# Patient Record
Sex: Female | Born: 1960 | Race: Black or African American | Hispanic: No | Marital: Single | Smoking: Former smoker
Health system: Southern US, Community
[De-identification: ages and names within clinical notes are randomized; demographics above are authoritative.]

## PROBLEM LIST (undated history)

## (undated) DIAGNOSIS — E1169 Type 2 diabetes mellitus with other specified complication: Secondary | ICD-10-CM

## (undated) DIAGNOSIS — F32A Depression, unspecified: Secondary | ICD-10-CM

## (undated) DIAGNOSIS — E119 Type 2 diabetes mellitus without complications: Secondary | ICD-10-CM

## (undated) DIAGNOSIS — F419 Anxiety disorder, unspecified: Secondary | ICD-10-CM

## (undated) DIAGNOSIS — F329 Major depressive disorder, single episode, unspecified: Secondary | ICD-10-CM

## (undated) DIAGNOSIS — M349 Systemic sclerosis, unspecified: Secondary | ICD-10-CM

## (undated) DIAGNOSIS — Z860100 Personal history of colon polyps, unspecified: Secondary | ICD-10-CM

## (undated) DIAGNOSIS — H269 Unspecified cataract: Secondary | ICD-10-CM

## (undated) HISTORY — DX: Depression, unspecified: F32.A

## (undated) HISTORY — DX: Anxiety disorder, unspecified: F41.9

## (undated) HISTORY — DX: Hyperlipidemia, unspecified: E11.69

## (undated) HISTORY — DX: Systemic sclerosis, unspecified: M34.9

## (undated) HISTORY — PX: CHOLECYSTECTOMY: SHX55

## (undated) HISTORY — DX: Unspecified cataract: H26.9

## (undated) HISTORY — DX: Personal history of colonic polyps: Z86.010

## (undated) HISTORY — DX: Personal history of colon polyps, unspecified: Z86.0100

## (undated) HISTORY — DX: Type 2 diabetes mellitus without complications: E11.9

## (undated) HISTORY — DX: Major depressive disorder, single episode, unspecified: F32.9

## (undated) HISTORY — PX: BREAST BIOPSY: SHX20

---

## 2015-09-25 HISTORY — PX: OTHER SURGICAL HISTORY: SHX169

## 2017-12-27 ENCOUNTER — Encounter: Payer: Self-pay | Admitting: Family Medicine

## 2017-12-27 ENCOUNTER — Other Ambulatory Visit: Payer: Self-pay

## 2017-12-27 ENCOUNTER — Ambulatory Visit (INDEPENDENT_AMBULATORY_CARE_PROVIDER_SITE_OTHER): Payer: Medicare HMO | Admitting: Family Medicine

## 2017-12-27 VITALS — BP 118/62 | HR 96 | Temp 98.5°F | Ht 69.0 in | Wt 177.6 lb

## 2017-12-27 DIAGNOSIS — Z794 Long term (current) use of insulin: Secondary | ICD-10-CM

## 2017-12-27 DIAGNOSIS — F3342 Major depressive disorder, recurrent, in full remission: Secondary | ICD-10-CM | POA: Diagnosis not present

## 2017-12-27 DIAGNOSIS — E119 Type 2 diabetes mellitus without complications: Secondary | ICD-10-CM

## 2017-12-27 LAB — POCT GLYCOSYLATED HEMOGLOBIN (HGB A1C): Hemoglobin A1C: 8

## 2017-12-27 MED ORDER — SITAGLIPTIN PHOSPHATE 100 MG PO TABS: 100.0000 mg | ORAL_TABLET | Freq: Every day | ORAL | 4 refills | Status: DC

## 2017-12-27 MED ORDER — METFORMIN HCL 1000 MG PO TABS: 1000.0000 mg | ORAL_TABLET | Freq: Two times a day (BID) | ORAL | 4 refills | Status: DC
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
## 2017-12-27 NOTE — Patient Instructions (Addendum)
   IF you received an x-ray today, you will receive an invoice from [facility]. Please contact  Radiology at [phone] with questions or concerns regarding your invoice.   IF you received labwork today, you will receive an invoice from [facility]. Please contact [facility] at [phone] with questions or concerns regarding your invoice.   Our billing staff will not be able to assist you with questions regarding bills from these companies.  You will be contacted with the lab results as soon as they are available. The fastest way to get your results is to activate your My Chart account. Instructions are located on the last page of this paperwork. If you have not heard from us regarding the results in 2 weeks, please contact this office.      Diabetes and Foot Care Diabetes may cause you to have problems because of poor blood supply (circulation) to your feet and legs. This may cause the skin on your feet to become thinner, break easier, and heal more slowly. Your skin may become dry, and the skin may peel and crack. You may also have nerve damage in your legs and feet causing decreased feeling in them. You may not notice minor injuries to your feet that could lead to infections or more serious problems. Taking care of your feet is one of the most important things you can do for yourself. Follow these instructions at home:  Wear shoes at all times, even in the house. Do not go barefoot. Bare feet are easily injured.  Check your feet daily for blisters, cuts, and redness. If you cannot see the bottom of your feet, use a mirror or ask someone for help.  Wash your feet with warm water (do not use hot water) and mild soap. Then pat your feet and the areas between your toes until they are completely dry. Do not soak your feet as this can dry your skin.  Apply a moisturizing lotion or petroleum jelly (that does not contain alcohol and is unscented) to the skin on your feet and  to dry, brittle toenails. Do not apply lotion between your toes.  Trim your toenails straight across. Do not dig under them or around the cuticle. File the edges of your nails with an emery board or nail file.  Do not cut corns or calluses or try to remove them with medicine.  Wear clean socks or stockings every day. Make sure they are not too tight. Do not wear knee-high stockings since they may decrease blood flow to your legs.  Wear shoes that fit properly and have enough cushioning. To break in new shoes, wear them for just a few hours a day. This prevents you from injuring your feet. Always look in your shoes before you put them on to be sure there are no objects inside.  Do not cross your legs. This may decrease the blood flow to your feet.  If you find a minor scrape, cut, or break in the skin on your feet, keep it and the skin around it clean and dry. These areas may be cleansed with mild soap and water. Do not cleanse the area with peroxide, alcohol, or iodine.  When you remove an adhesive bandage, be sure not to damage the skin around it.  If you have a wound, look at it several times a day to make sure it is healing.  Do not use heating pads or hot water bottles. They may burn your skin. If you have lost   feeling in your feet or legs, you may not know it is happening until it is too late.  Make sure your health care provider performs a complete foot exam at least annually or more often if you have foot problems. Report any cuts, sores, or bruises to your health care provider immediately. Contact a health care provider if:  You have an injury that is not healing.  You have cuts or breaks in the skin.  You have an ingrown nail.  You notice redness on your legs or feet.  You feel burning or tingling in your legs or feet.  You have pain or cramps in your legs and feet.  Your legs or feet are numb.  Your feet always feel cold. Get help right away if:  There is increasing  redness, swelling, or pain in or around a wound.  There is a red line that goes up your leg.  Pus is coming from a wound.  You develop a fever or as directed by your health care provider.  You notice a bad smell coming from an ulcer or wound. This information is not intended to replace advice given to you by your health care provider. Make sure you discuss any questions you have with your health care provider. Document Released: 09/07/2000 Document Revised: 02/16/2016 Document Reviewed: 02/17/2013 Elsevier Interactive Patient Education  2017 Elsevier Inc.  

## 2017-12-28 LAB — CBC
Hematocrit: 38 % (ref 34.0–46.6)
Hemoglobin: 12.3 g/dL (ref 11.1–15.9)
MCH: 28.1 pg (ref 26.6–33.0)
MCHC: 32.4 g/dL (ref 31.5–35.7)
MCV: 87 fL (ref 79–97)
Platelets: 331 10*3/uL (ref 150–379)
RBC: 4.38 x10E6/uL (ref 3.77–5.28)
RDW: 14.5 % (ref 12.3–15.4)
WBC: 4.7 10*3/uL (ref 3.4–10.8)

## 2017-12-28 LAB — COMPREHENSIVE METABOLIC PANEL
ALT: 15 IU/L (ref 0–32)
AST: 12 IU/L (ref 0–40)
Albumin/Globulin Ratio: 1.6 (ref 1.2–2.2)
Albumin: 4.2 g/dL (ref 3.5–5.5)
Alkaline Phosphatase: 67 IU/L (ref 39–117)
BUN/Creatinine Ratio: 18 (ref 9–23)
BUN: 10 mg/dL (ref 6–24)
Bilirubin Total: 0.3 mg/dL (ref 0.0–1.2)
CO2: 25 mmol/L (ref 20–29)
Calcium: 9.5 mg/dL (ref 8.7–10.2)
Chloride: 101 mmol/L (ref 96–106)
Creatinine, Ser: 0.57 mg/dL (ref 0.57–1.00)
GFR calc Af Amer: 118 mL/min/{1.73_m2} (ref >59)
GFR calc non Af Amer: 103 mL/min/{1.73_m2} (ref >59)
Globulin, Total: 2.6 g/dL (ref 1.5–4.5)
Glucose: 236 mg/dL — ABNORMAL HIGH (ref 65–99)
Potassium: 4.5 mmol/L (ref 3.5–5.2)
Sodium: 142 mmol/L (ref 134–144)
Total Protein: 6.8 g/dL (ref 6.0–8.5)

## 2017-12-28 LAB — LIPID PANEL
Chol/HDL Ratio: 2.3 ratio (ref 0.0–4.4)
Cholesterol, Total: 122 mg/dL (ref 100–199)
HDL: 53 mg/dL (ref >39)
LDL Calculated: 50 mg/dL (ref 0–99)
Triglycerides: 93 mg/dL (ref 0–149)
VLDL Cholesterol Cal: 19 mg/dL (ref 5–40)

## 2017-12-28 LAB — TSH: TSH: 1.23 u[IU]/mL (ref 0.450–4.500)

## 2017-12-28 LAB — MICROALBUMIN / CREATININE URINE RATIO
Creatinine, Urine: 83.5 mg/dL
Microalb/Creat Ratio: <3.6 mg/g creat (ref 0.0–30.0)
Microalbumin, Urine: <3 ug/mL

## 2018-01-01 ENCOUNTER — Encounter: Payer: Self-pay | Admitting: *Deleted

## 2018-03-19 ENCOUNTER — Telehealth: Payer: Self-pay | Admitting: Family Medicine

## 2018-03-19 NOTE — Telephone Encounter (Signed)
**  tried to call pt to let her know that her 03/28/18 appt with Dr. Leretha PolSantiago will be in the [address]. Her phone is not accepting calls at this time**  If pt calls back, please inform her that her appt on 03/28/18 with Dr. Leretha PolSantiago will be in the 102 building instead of the [address] b[address]**  Thanks!

## 2018-03-28 ENCOUNTER — Ambulatory Visit: Payer: Medicare HMO | Admitting: Family Medicine

## 2018-04-01 ENCOUNTER — Encounter: Payer: Self-pay | Admitting: Family Medicine

## 2018-04-01 ENCOUNTER — Ambulatory Visit (INDEPENDENT_AMBULATORY_CARE_PROVIDER_SITE_OTHER): Payer: Medicare HMO | Admitting: Family Medicine

## 2018-04-01 VITALS — BP 116/62 | HR 82 | Temp 98.2°F | Ht 69.0 in | Wt 179.2 lb

## 2018-04-01 DIAGNOSIS — F3342 Major depressive disorder, recurrent, in full remission: Secondary | ICD-10-CM

## 2018-04-01 DIAGNOSIS — M349 Systemic sclerosis, unspecified: Secondary | ICD-10-CM

## 2018-04-01 DIAGNOSIS — Z794 Long term (current) use of insulin: Secondary | ICD-10-CM

## 2018-04-01 DIAGNOSIS — E119 Type 2 diabetes mellitus without complications: Secondary | ICD-10-CM | POA: Diagnosis not present

## 2018-04-01 LAB — POCT GLYCOSYLATED HEMOGLOBIN (HGB A1C): Hemoglobin A1C: 8.2 % — AB (ref 4.0–5.6)

## 2018-04-01 MED ORDER — SITAGLIPTIN PHOSPHATE 25 MG PO TABS: 25.0000 mg | ORAL_TABLET | Freq: Every day | ORAL | 4 refills | Status: DC

## 2018-04-01 NOTE — Patient Instructions (Signed)
     IF you received an x-ray today, you will receive an invoice from [facility]. Please contact [facility] at [phone] with questions or concerns regarding your invoice.   IF you received labwork today, you will receive an invoice from [facility]. Please contact [facility] at [phone] with questions or concerns regarding your invoice.   Our billing staff will not be able to assist you with questions regarding bills from these companies.  You will be contacted with the lab results as soon as they are available. The fastest way to get your results is to activate your My Chart account. Instructions are located on the last page of this paperwork. If you have not heard from us regarding the results in 2 weeks, please contact this office.     

## 2018-04-01 NOTE — Progress Notes (Signed)
7/9/20192:52 PM  Jennifer KennerFay Gaeta September 28, 1960, 57 y.o. female [id number]  Chief Complaint  Patient presents with  . Follow-up    DM management. Stopped taking the Januvia everyday due to giving her the shakes. Walks alot, legs are jumpy when she lays down to sleep    HPI:   Patient is a 57 y.o. female with past medical history significant for DM2, scleroderma and depression who presents today for followup  States she had to stop [place] as it was causing sx of hypoglycemia sp after her walks Last a1c 8.0 April 2019 LDL 50 April 2019 She otherwise has been taking her other meds as prescribed, denies any side effects She reports depression is well controlled She is adjusting well, has a routine She has no acute concerns today Wondering if she could do cool sculpting with scleroderma  Fall Risk  04/01/2018 12/27/2017  Falls in the past year? No No     Depression screen PHQ 2/9 04/01/2018  Decreased Interest 0  Down, Depressed, Hopeless 0  PHQ - 2 Score 0    Allergies  Allergen Reactions  . Aspirin     Prior to Admission medications   Medication Sig Start Date End Date Taking? Authorizing Provider  aluminum-magnesium hydroxide-simethicone (MAALOX) 200-200-20 MG/5ML SUSP Take 30 mLs by mouth 4 (four) times daily -  before meals and at bedtime.    [provider]  atorvastatin (LIPITOR) 20 MG tablet Take 20 mg by mouth 1 day or 1 dose.    [provider]  citalopram (CELEXA) 20 MG tablet Take 20 mg by mouth daily.    [provider]  metFORMIN (GLUCOPHAGE) 1000 MG tablet Take 1 tablet (1,000 mg total) by mouth 2 (two) times daily with a meal. 12/27/17   Myles LippsSantiago, Irma M, MD  sitaGLIPtin (JANUVIA) 100 MG tablet Take 1 tablet (100 mg total) by mouth daily. 12/27/17   Myles LippsSantiago, Irma M, MD    Past Medical History:  Diagnosis Date  . Anxiety   . Cataract   . Depression   . Diabetes mellitus without complication (HCC)   . History of colonic polyps   .  Scleroderma [facility](HCC)     Past Surgical History:  Procedure Laterality Date  . CHOLECYSTECTOMY    . colonoscopy  2017   + polyps, repeat in 5 years    Social History   Tobacco Use  . Smoking status: Never Smoker  . Smokeless tobacco: Never Used  Substance Use Topics  . Alcohol use: Never    Frequency: Never    Family History  Problem Relation Age of Onset  . Diabetes Mother   . Mental illness Mother   . Cancer Father     Review of Systems  Constitutional: Negative for chills and fever.  Respiratory: Negative for cough and shortness of breath.   Cardiovascular: Negative for chest pain, palpitations and leg swelling.  Gastrointestinal: Negative for abdominal pain, nausea and vomiting.     OBJECTIVE:  Blood pressure 116/62, pulse 82, temperature 98.2 F (36.8 C), temperature source Oral, height 5\' 9"  (1.753 m), weight 179 lb 3.2 oz (81.3 kg), SpO2 99 %.  Physical Exam  Constitutional: She is oriented to person, place, and time. She appears well-developed and well-nourished.  HENT:  Head: Normocephalic and atraumatic.  Mouth/Throat: Mucous membranes are normal.  Eyes: Pupils are equal, round, and reactive to light. EOM are normal. No scleral icterus.  Neck: Neck supple.  Pulmonary/Chest: Effort normal.  Neurological: She is alert and oriented  to person, place, and time.  Skin: Skin is warm and dry.  Psychiatric: She has a normal mood and affect.  Nursing note and vitals reviewed.   Results for orders placed or performed in visit on 04/01/18 (from the past 24 hour(s))  POCT glycosylated hemoglobin (Hb A1C)     Status: Abnormal   Collection Time: 04/01/18  3:12 PM  Result Value Ref Range   Hemoglobin A1C 8.2 (A) 4.0 - 5.6 %   HbA1c POC (<> result, manual entry)  4.0 - 5.6 %   HbA1c, POC (prediabetic range)  5.7 - 6.4 %   HbA1c, POC (controlled diabetic range)  0.0 - 7.0 %      ASSESSMENT and PLAN  1. Type 2 diabetes mellitus without complication, with  long-term current use of insulin (HCC) Not controlled, did not tolerate max dose of januvia, decreasing dose. Cont with diet and exercise changes.  - POCT glycosylated hemoglobin (Hb A1C) - sitaGLIPtin (JANUVIA) 25 MG tablet; Take 1 tablet (25 mg total) by mouth daily.  2. Recurrent major depressive disorder, in full remission (HCC) Controlled. Continue current regime.   I told patient I would try to investigate regarding scleroderma and coolscultping but that she should also ask the dermatologist providing treatment.  Return in about 3 months (around 07/02/2018).    Myles Lipps, MD Primary Care at [facility] [address] [place], [place] [address] Ph.  [phone] Fax [phone]

## 2018-05-12 ENCOUNTER — Telehealth: Payer: Self-pay | Admitting: General Practice

## 2018-05-12 NOTE — Telephone Encounter (Signed)
Copied from CRM [id number]. Topic: Quick Communication - Rx Refill/Question >> May 12, 2018 12:31 PM Crist InfanteHarrald, Kathy J wrote: Medication:  atorvastatin (LIPITOR) 20 MG tablet  citalopram (CELEXA) 20 MG tablet  Pt is new to Dr Leretha PolSantiago, so will be new scripts sent for pt from the dr 90 day [facility] - [place], [place] - [address] [phone] (Phone) [phone] (Fax)

## 2018-05-15 MED ORDER — CITALOPRAM HYDROBROMIDE 20 MG PO TABS: 20.0000 mg | ORAL_TABLET | Freq: Every day | ORAL | 1 refills | Status: DC

## 2018-05-15 MED ORDER — ATORVASTATIN CALCIUM 20 MG PO TABS: 20.0000 mg | ORAL_TABLET | ORAL | 1 refills | Status: DC

## 2018-05-15 NOTE — Telephone Encounter (Signed)
Patient had already established with me in July Refilled for 90 days and 1 refill

## 2018-05-15 NOTE — Addendum Note (Signed)
Addended by: Myles LippsSANTIAGO, Sharisa Toves M on: 05/15/2018 06:02 PM   Modules accepted: Orders

## 2018-06-05 ENCOUNTER — Telehealth: Payer: Self-pay

## 2018-06-05 MED ORDER — ATORVASTATIN CALCIUM 20 MG PO TABS: 20.0000 mg | ORAL_TABLET | Freq: Every day | ORAL | 1 refills | Status: DC

## 2018-06-05 NOTE — Telephone Encounter (Addendum)
Jennifer Rhodes w/[facility] [phone] need clarification on the Atorvastatin prescription.  He said the instructions says one day or one dose, and that must be a mistake.

## 2018-06-05 NOTE — Telephone Encounter (Signed)
Pls verify how you want this medication to be taken Atorvastatin 20mg  sig currently written: Take po 1 day or 1 dose  [facility] needs clarification  Thanks

## 2018-06-05 NOTE — Telephone Encounter (Signed)
Corrected rx sent

## 2018-06-06 ENCOUNTER — Other Ambulatory Visit: Payer: Self-pay | Admitting: Family Medicine

## 2018-07-01 ENCOUNTER — Ambulatory Visit: Payer: Medicare HMO | Admitting: Family Medicine

## 2018-07-16 ENCOUNTER — Ambulatory Visit (INDEPENDENT_AMBULATORY_CARE_PROVIDER_SITE_OTHER): Payer: Medicare HMO | Admitting: Family Medicine

## 2018-07-16 ENCOUNTER — Encounter: Payer: Self-pay | Admitting: Family Medicine

## 2018-07-16 VITALS — BP 134/72 | HR 80 | Temp 98.1°F | Resp 17 | Ht 69.0 in | Wt 180.0 lb

## 2018-07-16 DIAGNOSIS — F3342 Major depressive disorder, recurrent, in full remission: Secondary | ICD-10-CM | POA: Diagnosis not present

## 2018-07-16 DIAGNOSIS — Z23 Encounter for immunization: Secondary | ICD-10-CM

## 2018-07-16 DIAGNOSIS — Z794 Long term (current) use of insulin: Secondary | ICD-10-CM | POA: Diagnosis not present

## 2018-07-16 DIAGNOSIS — E119 Type 2 diabetes mellitus without complications: Secondary | ICD-10-CM | POA: Diagnosis not present

## 2018-07-16 MED ORDER — CITALOPRAM HYDROBROMIDE 20 MG PO TABS: 20.0000 mg | ORAL_TABLET | Freq: Every day | ORAL | 3 refills | Status: DC

## 2018-07-16 MED ORDER — ATORVASTATIN CALCIUM 20 MG PO TABS: 20.0000 mg | ORAL_TABLET | Freq: Every day | ORAL | 3 refills | Status: DC

## 2018-07-16 MED ORDER — SITAGLIPTIN PHOSPHATE 25 MG PO TABS: 25.0000 mg | ORAL_TABLET | Freq: Every day | ORAL | 1 refills | Status: DC

## 2018-07-16 MED ORDER — METFORMIN HCL 1000 MG PO TABS: ORAL_TABLET | ORAL | 1 refills | Status: DC

## 2018-07-16 NOTE — Patient Instructions (Signed)
° ° ° °  If you have lab work done today you will be contacted with your lab results within the next 2 weeks.  If you have not heard from us then please contact us. The fastest way to get your results is to register for My Chart. ° ° °IF you received an x-ray today, you will receive an invoice from [facility]. Please contact [facility] at [phone] with questions or concerns regarding your invoice.  ° °IF you received labwork today, you will receive an invoice from [facility]. Please contact [facility] at [phone] with questions or concerns regarding your invoice.  ° °Our billing staff will not be able to assist you with questions regarding bills from these companies. ° °You will be contacted with the lab results as soon as they are available. The fastest way to get your results is to activate your My Chart account. Instructions are located on the last page of this paperwork. If you have not heard from us regarding the results in 2 weeks, please contact this office. °  ° ° ° °

## 2018-07-16 NOTE — Progress Notes (Signed)
10/23/20192:58 PM  Jennifer Rhodes 11/09/1960, 57 y.o. female [id number]  Chief Complaint  Patient presents with  . Follow-up    diabetes    HPI:   Patient is a 57 y.o. female with past medical history significant for DM2, scleroderma and depression who presents today for followup  Overall doing well Has not been checking cbgs of lately, needs more strips Tolerating low dose januvia well Never got prescription for lipitor Needs referral to eye doctor  Depression is well controlled  Lab Results  Component Value Date   HGBA1C 8.2 (A) 04/01/2018   HGBA1C 8 12/27/2017   Lab Results  Component Value Date   LDLCALC 50 12/27/2017   CREATININE 0.57 12/27/2017    Fall Risk  04/01/2018 12/27/2017  Falls in the past year? No No     Depression screen PHQ 2/9 04/01/2018  Decreased Interest 0  Down, Depressed, Hopeless 0  PHQ - 2 Score 0    Allergies  Allergen Reactions  . Aspirin     Prior to Admission medications   Medication Sig Start Date End Date Taking? Authorizing Provider  citalopram (CELEXA) 20 MG tablet Take 1 tablet (20 mg total) by mouth daily. 05/15/18  Yes Myles Lipps, MD  metFORMIN (GLUCOPHAGE) 1000 MG tablet TAKE 1 TABLET BY MOUTH TWICE DAILY WITH A MEAL 06/06/18  Yes Myles Lipps, MD  sitaGLIPtin (JANUVIA) 25 MG tablet Take 1 tablet (25 mg total) by mouth daily. 04/01/18  Yes Myles Lipps, MD  aluminum-magnesium hydroxide-simethicone (MAALOX) 200-200-20 MG/5ML SUSP Take 30 mLs by mouth 4 (four) times daily -  before meals and at bedtime.    [provider]  atorvastatin (LIPITOR) 20 MG tablet Take 1 tablet (20 mg total) by mouth daily. Patient not taking: Reported on 07/16/2018 06/05/18   Myles Lipps, MD    Past Medical History:  Diagnosis Date  . Anxiety   . Cataract   . Depression   . Diabetes mellitus without complication (HCC)   . History of colonic polyps   . Scleroderma [facility])     Past Surgical History:  Procedure Laterality  Date  . CHOLECYSTECTOMY    . colonoscopy  2017   + polyps, repeat in 5 years    Social History   Tobacco Use  . Smoking status: Never Smoker  . Smokeless tobacco: Never Used  Substance Use Topics  . Alcohol use: Never    Frequency: Never    Family History  Problem Relation Age of Onset  . Diabetes Mother   . Mental illness Mother   . Cancer Father     Review of Systems  Constitutional: Negative for chills and fever.  Respiratory: Negative for cough and shortness of breath.   Cardiovascular: Negative for chest pain, palpitations and leg swelling.  Gastrointestinal: Positive for abdominal pain (has been wearing a back brace). Negative for nausea and vomiting.     OBJECTIVE:  Blood pressure 134/72, pulse 80, temperature 98.1 F (36.7 C), temperature source Oral, resp. rate 17, height 5\' 9"  (1.753 m), weight 180 lb (81.6 kg), SpO2 98 %. Body mass index is 26.58 kg/m.   Wt Readings from Last 3 Encounters:  07/16/18 180 lb (81.6 kg)  04/01/18 179 lb 3.2 oz (81.3 kg)  12/27/17 177 lb 9.6 oz (80.6 kg)    Physical Exam  Constitutional: She is oriented to person, place, and time. She appears well-developed and well-nourished.  HENT:  Head: Normocephalic and atraumatic.  Mouth/Throat: Oropharynx is clear  and moist. No oropharyngeal exudate.  Eyes: Pupils are equal, round, and reactive to light. Conjunctivae and EOM are normal. No scleral icterus.  Neck: Neck supple.  Cardiovascular: Normal rate, regular rhythm and normal heart sounds. Exam reveals no gallop and no friction rub.  No murmur heard. Pulmonary/Chest: Effort normal and breath sounds normal. She has no wheezes. She has no rales.  Abdominal: Soft. Bowel sounds are normal. She exhibits no distension and no mass. There is no tenderness.  Musculoskeletal: She exhibits no edema.  Neurological: She is alert and oriented to person, place, and time.  Skin: Skin is warm and dry.  Psychiatric: She has a normal mood and  affect.  Nursing note and vitals reviewed.   ASSESSMENT and PLAN  1. Type 2 diabetes mellitus without complication, with long-term current use of insulin (HCC) Checking labs today, medications will be adjusted as needed.  - Basic Metabolic Panel - Hemoglobin A1c - Ambulatory referral to Ophthalmology  2. Recurrent major depressive disorder, in full remission (HCC) Controlled. Continue current regime.   Other orders - atorvastatin (LIPITOR) 20 MG tablet; Take 1 tablet (20 mg total) by mouth daily. - citalopram (CELEXA) 20 MG tablet; Take 1 tablet (20 mg total) by mouth daily. - metFORMIN (GLUCOPHAGE) 1000 MG tablet; TAKE 1 TABLET BY MOUTH TWICE DAILY WITH A MEAL - sitaGLIPtin (JANUVIA) 25 MG tablet; Take 1 tablet (25 mg total) by mouth daily. - Flu Vaccine QUAD 36+ mos IM   Return in about 3 months (around 10/16/2018).    Myles Lipps, MD Primary Care at [facility] [address] [place], [place] [address] Ph.  [phone] Fax [phone]

## 2018-07-17 LAB — BASIC METABOLIC PANEL
BUN/Creatinine Ratio: 16 (ref 9–23)
BUN: 10 mg/dL (ref 6–24)
CO2: 28 mmol/L (ref 20–29)
Calcium: 9.7 mg/dL (ref 8.7–10.2)
Chloride: 100 mmol/L (ref 96–106)
Creatinine, Ser: 0.62 mg/dL (ref 0.57–1.00)
GFR calc Af Amer: 114 mL/min/{1.73_m2} (ref >59)
GFR calc non Af Amer: 99 mL/min/{1.73_m2} (ref >59)
Glucose: 311 mg/dL — ABNORMAL HIGH (ref 65–99)
Potassium: 4.8 mmol/L (ref 3.5–5.2)
Sodium: 140 mmol/L (ref 134–144)

## 2018-07-17 LAB — HEMOGLOBIN A1C
Est. average glucose Bld gHb Est-mCnc: 249 mg/dL
Hgb A1c MFr Bld: 10.3 % — ABNORMAL HIGH (ref 4.8–5.6)

## 2018-07-17 MED ORDER — ERTUGLIFLOZIN L-PYROGLUTAMICAC 5 MG PO TABS: 5.0000 mg | ORAL_TABLET | Freq: Every day | ORAL | 2 refills | Status: DC

## 2018-07-17 NOTE — Addendum Note (Signed)
Addended by: Myles Lipps on: 07/17/2018 11:42 PM   Modules accepted: Orders

## 2018-07-23 ENCOUNTER — Telehealth: Payer: Self-pay | Admitting: Family Medicine

## 2018-07-23 NOTE — Telephone Encounter (Signed)
Copied from CRM [id number]. Topic: General - Other >> Jul 23, 2018  9:35 AM Elliot Gault wrote:  Relation to pt: self  Call back number: [phone] Pharmacy: [facility] [address] [place] - [address] [phone] (Phone) [phone] (Fax)  Reason for call: Patient states diabetic medication prescribed (patient doesn't know the name of Rx) not covered under [facility], patient requesting alternate as soon as possible.

## 2018-07-23 NOTE — Telephone Encounter (Signed)
Patient calls and says Steglatro 5mg  is not covered under [facility] and is requesting a new medication to be sent in to her pharmacy as soon as possible. Patient's CB [phone].  Pharmacy: [facility] [address] [place] - [address] [phone] (Phone) [phone] (Fax)

## 2018-07-24 MED ORDER — EMPAGLIFLOZIN 10 MG PO TABS: 10.0000 mg | ORAL_TABLET | Freq: Every day | ORAL | 2 refills | Status: DC

## 2018-07-24 NOTE — Telephone Encounter (Signed)
Please let patient know that I sent rx for jardiance 10mg  once a day to [facility]. thanks

## 2018-07-24 NOTE — Telephone Encounter (Signed)
Pt message sent to Dr. Leretha Pol Re: substitue for Steglatro (not covered by [facility])

## 2018-07-25 NOTE — Telephone Encounter (Signed)
Called and inform of Rx sent to pharmacy, she verbalized understanding.

## 2018-10-17 ENCOUNTER — Ambulatory Visit: Payer: Medicare HMO | Admitting: Family Medicine

## 2018-10-20 ENCOUNTER — Ambulatory Visit: Payer: Medicare HMO | Admitting: Family Medicine

## 2018-11-27 ENCOUNTER — Ambulatory Visit: Payer: Medicare HMO | Admitting: Family Medicine

## 2019-01-06 ENCOUNTER — Other Ambulatory Visit: Payer: Self-pay

## 2019-01-06 DIAGNOSIS — Z794 Long term (current) use of insulin: Principal | ICD-10-CM

## 2019-01-06 DIAGNOSIS — E119 Type 2 diabetes mellitus without complications: Secondary | ICD-10-CM

## 2019-01-16 ENCOUNTER — Other Ambulatory Visit: Payer: Self-pay

## 2019-01-16 ENCOUNTER — Telehealth (INDEPENDENT_AMBULATORY_CARE_PROVIDER_SITE_OTHER): Payer: Medicare PPO | Admitting: Family Medicine

## 2019-01-16 DIAGNOSIS — R002 Palpitations: Secondary | ICD-10-CM | POA: Diagnosis not present

## 2019-01-16 DIAGNOSIS — F3342 Major depressive disorder, recurrent, in full remission: Secondary | ICD-10-CM | POA: Diagnosis not present

## 2019-01-16 DIAGNOSIS — H538 Other visual disturbances: Secondary | ICD-10-CM

## 2019-01-16 DIAGNOSIS — E1165 Type 2 diabetes mellitus with hyperglycemia: Secondary | ICD-10-CM

## 2019-01-16 NOTE — Progress Notes (Signed)
Virtual Visit Note  I connected with patient on 01/16/19 at 1006am by telephone per patient's preference and verified that I am speaking with the correct person using two identifiers. Jennifer Rhodes is currently located at home and patient is currently with them during visit. The provider, Myles Lipps, MD is located in their office at time of visit.  I discussed the limitations, risks, security and privacy concerns of performing an evaluation and management service by telephone and the availability of in person appointments. I also discussed with the patient that there may be a patient responsible charge related to this service. The patient expressed understanding and agreed to proceed.   CC: diabetes followup  HPI ? Patient is a 58 y.o. female with past medical history significant for DM2, scleroderma and depressionwho presents today forfollowup  Last OV Oct 2019 Started rx for jardiance 10mg  She tried Theme park [employer] but caused stomach pain and dry mouth Taking metformin Walking and eating smaller portions Thinks she has lots weight, feels clothes are loser Not checking cbgs often, runs out strips, finances limiting Denies any polydipsia or polyuria Denies any numbness or tingling in her feet Denies any abd pain, nausea, vomiting Endorses occasional vision blurriness, worse at night, couple of months, has known cataracts, denies h/o retinopathy Denies chest pain, cough, SOB Endorses couple episodes of palpitations regular while walking, very short lived, denies any associated sx, last episode about a week ago  Endorses occasional ankle swelling after long periods of standing Feels depression is stable  Lab Results  Component Value Date   HGBA1C 10.3 (H) 07/16/2018   HGBA1C 8.2 (A) 04/01/2018   HGBA1C 8 12/27/2017   Lab Results  Component Value Date   LDLCALC 50 12/27/2017   CREATININE 0.62 07/16/2018    Allergies  Allergen Reactions  . Aspirin     Prior to Admission  medications   Medication Sig Start Date End Date Taking? Authorizing Provider  atorvastatin (LIPITOR) 20 MG tablet Take 1 tablet (20 mg total) by mouth daily. 07/16/18  Yes Myles Lipps, MD  citalopram (CELEXA) 20 MG tablet Take 1 tablet (20 mg total) by mouth daily. 07/16/18  Yes Myles Lipps, MD  metFORMIN (GLUCOPHAGE) 1000 MG tablet TAKE 1 TABLET BY MOUTH TWICE DAILY WITH A MEAL 07/16/18  Yes Myles Lipps, MD  aluminum-magnesium hydroxide-simethicone (MAALOX) 200-200-20 MG/5ML SUSP Take 30 mLs by mouth 4 (four) times daily -  before meals and at bedtime.    [provider]  empagliflozin (JARDIANCE) 10 MG TABS tablet Take 10 mg by mouth daily. Patient not taking: Reported on 01/16/2019 07/24/18   Myles Lipps, MD    Past Medical History:  Diagnosis Date  . Anxiety   . Cataract   . Depression   . Diabetes mellitus without complication (HCC)   . History of colonic polyps   . Scleroderma [facility])     Past Surgical History:  Procedure Laterality Date  . CHOLECYSTECTOMY    . colonoscopy  2017   + polyps, repeat in 5 years    Social History   Tobacco Use  . Smoking status: Never Smoker  . Smokeless tobacco: Never Used  Substance Use Topics  . Alcohol use: Never    Frequency: Never    Family History  Problem Relation Age of Onset  . Diabetes Mother   . Mental illness Mother   . Cancer Father     ROS Per hpi  Objective  Vitals as reported by the patient:  none   ASSESSMENT and PLAN  1. Type 2 diabetes mellitus with hyperglycemia, without long-term current use of insulin (HCC) Uncontrolled per last a1c. Has been working on LFM. Checking labs, meds will be adjusted as needed.  - Hemoglobin A1c; Future - Microalbumin/Creatinine Ratio, Urine; Future - Comprehensive metabolic panel; Future - TSH; Future - Lipid panel; Future - Ambulatory referral to Ophthalmology  2. Recurrent major depressive disorder, in full remission (HCC) Controlled.  Continue current regime.   3. Blurry vision, bilateral Referring to ophtho for eval, could be needs eyeglasses vs progression of cataracts vs new retinopathy - Ambulatory referral to Ophthalmology  4. Palpitations In setting of exercise, asymptomatic. ER precautions discussed.  - EKG 12-Lead; Future - CBC; Future  FOLLOW-UP: labs, BP and ekg within 1 week. followup with me in 4 weeks.    The above assessment and management plan was discussed with the patient. The patient verbalized understanding of and has agreed to the management plan. Patient is aware to call the clinic if symptoms persist or worsen. Patient is aware when to return to the clinic for a follow-up visit. Patient educated on when it is appropriate to go to the emergency department.    I provided 16 minutes of non-face-to-face time during this encounter.  Myles LippsIrma M Santiago, MD Primary Care at [facility] [address] [place], [place] [address] Ph.  [phone] Fax [phone]

## 2019-01-16 NOTE — Progress Notes (Signed)
3 m DM f/u

## 2019-02-23 ENCOUNTER — Other Ambulatory Visit: Payer: Self-pay | Admitting: Family Medicine

## 2019-02-23 NOTE — Telephone Encounter (Signed)
Routing medication refill to provider for review - no current lab work.

## 2019-02-25 ENCOUNTER — Other Ambulatory Visit: Payer: Self-pay

## 2019-02-25 ENCOUNTER — Telehealth: Payer: Self-pay | Admitting: Family Medicine

## 2019-02-25 DIAGNOSIS — E1165 Type 2 diabetes mellitus with hyperglycemia: Secondary | ICD-10-CM

## 2019-02-25 MED ORDER — METFORMIN HCL 1000 MG PO TABS: ORAL_TABLET | ORAL | 1 refills | Status: DC

## 2019-02-25 NOTE — Telephone Encounter (Signed)
Copied from CRM [id number]. Topic: Quick Communication - Rx Refill/Question >> Feb 25, 2019 12:38 PM Jaquita Rector A wrote: Medication: metFORMIN (GLUCOPHAGE) 1000 MG tablet  Per patient she is completely out of medication. Please advise  Has the patient contacted their pharmacy? Yes.   (Agent: If no, request that the patient contact the pharmacy for the refill.) (Agent: If yes, when and what did the pharmacy advise?)  Preferred Pharmacy (with phone number or street name): [facility] [address] [place], [place] - [address] [phone] (Phone) [phone] (Fax)    Agent: Please be advised that RX refills may take up to 3 business days. We ask that you follow-up with your pharmacy.

## 2019-02-25 NOTE — Telephone Encounter (Signed)
Rx sent to pharmacy   

## 2019-05-28 DIAGNOSIS — F418 Other specified anxiety disorders: Secondary | ICD-10-CM | POA: Diagnosis not present

## 2019-05-28 DIAGNOSIS — Z6826 Body mass index (BMI) 26.0-26.9, adult: Secondary | ICD-10-CM | POA: Diagnosis not present

## 2019-05-28 DIAGNOSIS — F3341 Major depressive disorder, recurrent, in partial remission: Secondary | ICD-10-CM | POA: Diagnosis not present

## 2019-05-28 DIAGNOSIS — E1165 Type 2 diabetes mellitus with hyperglycemia: Secondary | ICD-10-CM | POA: Diagnosis not present

## 2019-05-28 DIAGNOSIS — E785 Hyperlipidemia, unspecified: Secondary | ICD-10-CM | POA: Diagnosis not present

## 2019-05-28 DIAGNOSIS — M349 Systemic sclerosis, unspecified: Secondary | ICD-10-CM | POA: Diagnosis not present

## 2019-07-06 DIAGNOSIS — H25813 Combined forms of age-related cataract, bilateral: Secondary | ICD-10-CM | POA: Diagnosis not present

## 2019-07-06 DIAGNOSIS — H40053 Ocular hypertension, bilateral: Secondary | ICD-10-CM | POA: Diagnosis not present

## 2019-07-06 DIAGNOSIS — E119 Type 2 diabetes mellitus without complications: Secondary | ICD-10-CM | POA: Diagnosis not present

## 2019-07-06 LAB — HM DIABETES EYE EXAM

## 2019-07-13 ENCOUNTER — Telehealth: Payer: Self-pay | Admitting: Family Medicine

## 2019-07-13 NOTE — Telephone Encounter (Signed)
Medication refill:   atorvastatin (LIPITOR) 20 MG tablet [[id number]]   citalopram (CELEXA) 20 MG tablet [[id number]  Pharmacy:  [facility] - [place], [place] [phone] (Phone) [phone] (Fax)   Pt aware of turn around time

## 2019-07-21 ENCOUNTER — Other Ambulatory Visit: Payer: Self-pay

## 2019-07-21 ENCOUNTER — Telehealth (INDEPENDENT_AMBULATORY_CARE_PROVIDER_SITE_OTHER): Payer: Medicare PPO | Admitting: Family Medicine

## 2019-07-21 ENCOUNTER — Telehealth: Payer: Medicare PPO | Admitting: Family Medicine

## 2019-07-21 DIAGNOSIS — R6889 Other general symptoms and signs: Secondary | ICD-10-CM | POA: Diagnosis not present

## 2019-07-21 DIAGNOSIS — E1165 Type 2 diabetes mellitus with hyperglycemia: Secondary | ICD-10-CM

## 2019-07-21 DIAGNOSIS — F3342 Major depressive disorder, recurrent, in full remission: Secondary | ICD-10-CM | POA: Diagnosis not present

## 2019-07-21 MED ORDER — CITALOPRAM HYDROBROMIDE 20 MG PO TABS: 20.0000 mg | ORAL_TABLET | Freq: Every day | ORAL | 3 refills | Status: DC

## 2019-07-21 MED ORDER — BLOOD GLUCOSE METER KIT: PACK | 3 refills | Status: DC

## 2019-07-21 MED ORDER — ATORVASTATIN CALCIUM 20 MG PO TABS: 20.0000 mg | ORAL_TABLET | Freq: Every day | ORAL | 3 refills | Status: DC

## 2019-07-21 MED ORDER — METFORMIN HCL 1000 MG PO TABS: 1000.0000 mg | ORAL_TABLET | Freq: Two times a day (BID) | ORAL | 0 refills | Status: DC

## 2019-07-21 NOTE — Progress Notes (Signed)
Virtual Visit Note  I connected with patient on 07/21/19 at 612pm by phone (unable to connect thru [email or web address]) and verified that I am speaking with the correct person using two identifiers. Jennifer Rhodes is currently located at home and patient is currently with them during visit. The provider, Rutherford Guys, MD is located in their office at time of visit.  I discussed the limitations, risks, security and privacy concerns of performing an evaluation and management service by telephone and the availability of in person appointments. I also discussed with the patient that there may be a patient responsible charge related to this service. The patient expressed understanding and agreed to proceed.   CC: DM2 and depression  HPI ? Last OV April 2020 - telemedicine Has not had labs since Oct 2019 She did have labs done thru [facility] in sept A1c 8.3, takes metformin 1038m BID She has not checked glucose at home - needs a new kit She has been walking daily, monitoring her diet She has lost 10 lbs Went to eye doctor, needs cataract surgery in both eyes but otherwise no DM changes  [employer] also did screening for PAD ABI right 0.78, left 1.13 Takes atorvastatin and ASA Non smoker She denies any claudication or skin changes  Depression well controlled with citalopram Needs refills  Lab Results  Component Value Date   HGBA1C 10.3 (H) 07/16/2018    Allergies  Allergen Reactions  . Aspirin   . Jardiance [Empagliflozin] Other (See Comments)    abd pain and dry mouth    Prior to Admission medications   Medication Sig Start Date End Date Taking? Authorizing Provider  aluminum-magnesium hydroxide-simethicone (MAALOX) 2035-465-68MG/5ML SUSP Take 30 mLs by mouth 4 (four) times daily -  before meals and at bedtime.    [provider]  atorvastatin (LIPITOR) 20 MG tablet Take 1 tablet (20 mg total) by mouth daily. 07/16/18   SRutherford Guys MD  citalopram (CELEXA) 20 MG tablet  Take 1 tablet (20 mg total) by mouth daily. 07/16/18   SRutherford Guys MD  metFORMIN (GLUCOPHAGE) 1000 MG tablet TAKE 1 TABLET BY MOUTH TWICE DAILY WITH A MEAL 02/25/19   SRutherford Guys MD  metFORMIN (GLUCOPHAGE) 1000 MG tablet TAKE 1 TABLET BY MOUTH TWICE DAILY WITH A MEAL 02/25/19   SRutherford Guys MD    Past Medical History:  Diagnosis Date  . Anxiety   . Cataract   . Depression   . [place]   . History of colonic polyps   . Scleroderma ([facility]     Past Surgical History:  Procedure Laterality Date  . CHOLECYSTECTOMY    . colonoscopy  2017   + polyps, repeat in 5 years    Social History   Tobacco Use  . Smoking status: Never Smoker  . Smokeless tobacco: Never Used  Substance Use Topics  . Alcohol use: Never    Frequency: Never    Family History  Problem Relation Age of Onset  . Diabetes Mother   . Mental illness Mother   . Cancer Father     Review of Systems  Constitutional: Negative for chills and fever.  Respiratory: Negative for cough and shortness of breath.   Cardiovascular: Negative for chest pain, palpitations and leg swelling.  Gastrointestinal: Negative for abdominal pain, nausea and vomiting.    Objective  Vitals as reported by the patient: none   ASSESSMENT and PLAN  1. Type 2 diabetes mellitus with  hyperglycemia, without long-term current use of insulin (HCC) Improved, cont with LFM  2. Recurrent major depressive disorder, in full remission ([place]) Stable. Cont current mgt - citalopram (CELEXA) 20 MG tablet; Take 1 tablet (20 mg total) by mouth daily.  3. Abnormal ankle brachial index (ABI) Asymptomatic. Referring to vasc surg, anticipate medical mgt - Ambulatory referral to Vascular Surgery  Other orders - metFORMIN (GLUCOPHAGE) 1000 MG tablet; Take 1 tablet (1,000 mg total) by mouth 2 (two) times daily with a meal. - atorvastatin (LIPITOR) 20 MG tablet; Take 1 tablet (20 mg total) by mouth daily. -  blood glucose meter kit and supplies; Per insurance preference. Check blood glucose once a day. Dx E11.65  FOLLOW-UP: 4 weeks   The above assessment and management plan was discussed with the patient. The patient verbalized understanding of and has agreed to the management plan. Patient is aware to call the clinic if symptoms persist or worsen. Patient is aware when to return to the clinic for a follow-up visit. Patient educated on when it is appropriate to go to the emergency department.    I provided 14 minutes of non-face-to-face time during this encounter.  Rutherford Guys, MD Primary Care at [place], [place] [address] Ph.  [phone] Fax [phone]

## 2019-07-21 NOTE — Progress Notes (Signed)
Pt is following up with dm, says she has not been able to chk her glucose in 3 wks, order for new meter pending. She is asking for a refill on the celebrex, screening completed. No other medical concerns. Pharmacy is the [facility]

## 2019-07-22 NOTE — Progress Notes (Signed)
Spoke with pt and scheduled appt °

## 2019-07-29 ENCOUNTER — Telehealth: Payer: Self-pay | Admitting: Family Medicine

## 2019-07-29 NOTE — Telephone Encounter (Unsigned)
Copied from [place] [id number]. Topic: General - Other >> Jul 29, 2019 10:18 AM Rainey Pines A wrote: Patient would like a callback from nurse in regards to her blood sugar medication. Please advise

## 2019-07-29 NOTE — Telephone Encounter (Signed)
Rx was sent on 07/21/2019

## 2019-07-30 ENCOUNTER — Other Ambulatory Visit: Payer: Self-pay | Admitting: Family Medicine

## 2019-07-30 ENCOUNTER — Encounter: Payer: Self-pay | Admitting: *Deleted

## 2019-07-30 LAB — HEMOGLOBIN A1C: Hemoglobin A1C: 8.3

## 2019-07-30 MED ORDER — GLIPIZIDE 5 MG PO TABS: 2.5000 mg | ORAL_TABLET | Freq: Two times a day (BID) | ORAL | 3 refills | Status: DC

## 2019-07-30 NOTE — Telephone Encounter (Signed)
Please let patient know that I [facility]ide, she is to take 1/2 tab with her metformin before breakfast and dinner. Continue checking cbgs. Be mindful of low cbgs. Keep appt as scheduled. thanks

## 2019-07-30 NOTE — Telephone Encounter (Signed)
Dr. Pamella Pert ,  Patient states she is checking her blood sugar 3 times a day and it is has been 249-289 once it was 193 but has consistently been above 200 she is scheduled for an appointment  11-16.   Wanted some advise on her insulin or would you like to see her sooner.

## 2019-08-03 ENCOUNTER — Telehealth: Payer: Self-pay

## 2019-08-03 NOTE — Telephone Encounter (Signed)
I have received a phone message note stating that miss Jennifer Rhodes at [facility] has called about an abnormal ABI result.   I have spoken to the Dr. Pamella Pert and she stated that this screening has been done by [facility] and we are unsure why the results did not get scanned or abstracted.   We are able to reorder the ABI if it is recommended. (Per provider) Please let us know.   I have attempted to call Jennifer Rhodes back. No answer so I left a message to call back on a secure voice mail.   Plan: we can relay message bold above if miss Jennifer Rhodes calls back.

## 2019-08-04 ENCOUNTER — Ambulatory Visit (INDEPENDENT_AMBULATORY_CARE_PROVIDER_SITE_OTHER): Payer: Medicare PPO | Admitting: Family Medicine

## 2019-08-04 VITALS — BP 134/72 | Ht 69.0 in | Wt 180.0 lb

## 2019-08-04 DIAGNOSIS — Z0000 Encounter for general adult medical examination without abnormal findings: Secondary | ICD-10-CM | POA: Diagnosis not present
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
## 2019-08-04 NOTE — Patient Instructions (Addendum)
Thank you for taking time to come for your Medicare Wellness Visit. I appreciate your ongoing commitment to your health goals. Please review the following plan we discussed and let me know if I can assist you in the future.  Kenyan Karnes LPN  Preventive Care 60-60 Years Old, Female Preventive care refers to visits with your health care provider and lifestyle choices that can promote health and wellness. This includes:  A yearly physical exam. This may also be called an annual well check.  Regular dental visits and eye exams.  Immunizations.  Screening for certain conditions.  Healthy lifestyle choices, such as eating a healthy diet, getting regular exercise, not using drugs or products that contain nicotine and tobacco, and limiting alcohol use. What can I expect for my preventive care visit? Physical exam Your health care provider will check your:  Height and weight. This may be used to calculate body mass index (BMI), which tells if you are at a healthy weight.  Heart rate and blood pressure.  Skin for abnormal spots. Counseling Your health care provider may ask you questions about your:  Alcohol, tobacco, and drug use.  Emotional well-being.  Home and relationship well-being.  Sexual activity.  Eating habits.  Work and work environment.  Method of birth control.  Menstrual cycle.  Pregnancy history. What immunizations do I need?  Influenza (flu) vaccine  This is recommended every year. Tetanus, diphtheria, and pertussis (Tdap) vaccine  You may need a Td booster every 10 years. Varicella (chickenpox) vaccine  You may need this if you have not been vaccinated. Zoster (shingles) vaccine  You may need this after age 60. Measles, mumps, and rubella (MMR) vaccine  You may need at least one dose of MMR if you were born in 1957 or later. You may also need a second dose. Pneumococcal conjugate (PCV13) vaccine  You may need this if you have certain conditions  and were not previously vaccinated. Pneumococcal polysaccharide (PPSV23) vaccine  You may need one or two doses if you smoke cigarettes or if you have certain conditions. Meningococcal conjugate (MenACWY) vaccine  You may need this if you have certain conditions. Hepatitis A vaccine  You may need this if you have certain conditions or if you travel or work in places where you may be exposed to hepatitis A. Hepatitis B vaccine  You may need this if you have certain conditions or if you travel or work in places where you may be exposed to hepatitis B. Haemophilus influenzae type b (Hib) vaccine  You may need this if you have certain conditions. Human papillomavirus (HPV) vaccine  If recommended by your health care provider, you may need three doses over 6 months. You may receive vaccines as individual doses or as more than one vaccine together in one shot (combination vaccines). Talk with your health care provider about the risks and benefits of combination vaccines. What tests do I need? Blood tests  Lipid and cholesterol levels. These may be checked every 5 years, or more frequently if you are over 50 years old.  Hepatitis C test.  Hepatitis B test. Screening  Lung cancer screening. You may have this screening every year starting at age 55 if you have a 30-pack-year history of smoking and currently smoke or have quit within the past 15 years.  Colorectal cancer screening. All adults should have this screening starting at age 50 and continuing until age 75. Your health care provider may recommend screening at age 45 if you are   at increased risk. You will have tests every 1-10 years, depending on your results and the type of screening test.  Diabetes screening. This is done by checking your blood sugar (glucose) after you have not eaten for a while (fasting). You may have this done every 1-3 years.  Mammogram. This may be done every 1-2 years. Talk with your health care provider  about when you should start having regular mammograms. This may depend on whether you have a family history of breast cancer.  BRCA-related cancer screening. This may be done if you have a family history of breast, ovarian, tubal, or peritoneal cancers.  Pelvic exam and Pap test. This may be done every 3 years starting at age 21. Starting at age 30, this may be done every 5 years if you have a Pap test in combination with an HPV test. Other tests  Sexually transmitted disease (STD) testing.  Bone density scan. This is done to screen for osteoporosis. You may have this scan if you are at high risk for osteoporosis. Follow these instructions at home: Eating and drinking  Eat a diet that includes fresh fruits and vegetables, whole grains, lean protein, and low-fat dairy.  Take vitamin and mineral supplements as recommended by your health care provider.  Do not drink alcohol if: ? Your health care provider tells you not to drink. ? You are pregnant, may be pregnant, or are planning to become pregnant.  If you drink alcohol: ? Limit how much you have to 0-1 drink a day. ? Be aware of how much alcohol is in your drink. In the [place] one drink equals one 12 oz bottle of beer (355 mL), one 5 oz glass of wine (148 mL), or one 1 oz glass of hard liquor (44 mL). Lifestyle  Take daily care of your teeth and gums.  Stay active. Exercise for at least 30 minutes on 5 or more days each week.  Do not use any products that contain nicotine or tobacco, such as cigarettes, e-cigarettes, and chewing tobacco. If you need help quitting, ask your health care provider.  If you are sexually active, practice safe sex. Use a condom or other form of birth control (contraception) in order to prevent pregnancy and STIs (sexually transmitted infections).  If told by your health care provider, take low-dose aspirin daily starting at age 50. What's next?  Visit your health care provider once a year for a well  check visit.  Ask your health care provider how often you should have your eyes and teeth checked.  Stay up to date on all vaccines. This information is not intended to replace advice given to you by your health care provider. Make sure you discuss any questions you have with your health care provider. Document Released: 10/07/2015 Document Revised: 05/22/2018 Document Reviewed: 05/22/2018 Elsevier Patient Education  2020 Elsevier Inc.  

## 2019-08-05 NOTE — Telephone Encounter (Signed)
Unable to reach patient  Please see message below no answer no voice mail  Please let patient know that I sent in a prescription to [facility] for glipizide, she is to take 1/2 tab with her metformin before breakfast and dinner. Continue checking cbgs. Be mindful of low cbgs. Keep appt as scheduled. thanks

## 2019-08-18 ENCOUNTER — Ambulatory Visit: Payer: Medicare PPO | Admitting: Family Medicine

## 2019-08-21 ENCOUNTER — Ambulatory Visit: Payer: Medicare PPO | Admitting: Family Medicine

## 2019-08-25 ENCOUNTER — Telehealth: Payer: Self-pay

## 2019-08-25 ENCOUNTER — Ambulatory Visit (INDEPENDENT_AMBULATORY_CARE_PROVIDER_SITE_OTHER): Payer: Medicare PPO | Admitting: Family Medicine

## 2019-08-25 ENCOUNTER — Encounter: Payer: Self-pay | Admitting: Family Medicine

## 2019-08-25 ENCOUNTER — Other Ambulatory Visit: Payer: Self-pay

## 2019-08-25 VITALS — BP 124/60 | HR 89 | Temp 96.4°F | Ht 69.0 in | Wt 179.0 lb

## 2019-08-25 DIAGNOSIS — E1165 Type 2 diabetes mellitus with hyperglycemia: Secondary | ICD-10-CM

## 2019-08-25 DIAGNOSIS — Z23 Encounter for immunization: Secondary | ICD-10-CM | POA: Diagnosis not present

## 2019-08-25 DIAGNOSIS — R6889 Other general symptoms and signs: Secondary | ICD-10-CM | POA: Diagnosis not present

## 2019-08-25 DIAGNOSIS — F3342 Major depressive disorder, recurrent, in full remission: Secondary | ICD-10-CM | POA: Diagnosis not present

## 2019-08-25 DIAGNOSIS — M349 Systemic sclerosis, unspecified: Secondary | ICD-10-CM | POA: Diagnosis not present

## 2019-08-25 DIAGNOSIS — R002 Palpitations: Secondary | ICD-10-CM | POA: Diagnosis not present

## 2019-08-25 MED ORDER — GLIPIZIDE 5 MG PO TABS: 5.0000 mg | ORAL_TABLET | Freq: Two times a day (BID) | ORAL | 0 refills | Status: DC

## 2019-08-25 NOTE — Telephone Encounter (Signed)
Sent fax to vein and vascular to let them know that the scan requested for this pt is located in the medica section of pt chart. Fax notice to [phone]

## 2019-08-25 NOTE — Patient Instructions (Signed)
° ° ° °  If you have lab work done today you will be contacted with your lab results within the next 2 weeks.  If you have not heard from us then please contact us. The fastest way to get your results is to register for My Chart. ° ° °IF you received an x-ray today, you will receive an invoice from [facility]. Please contact  Radiology at [phone] with questions or concerns regarding your invoice.  ° °IF you received labwork today, you will receive an invoice from [facility]. Please contact [facility] at [phone] with questions or concerns regarding your invoice.  ° °Our billing staff will not be able to assist you with questions regarding bills from these companies. ° °You will be contacted with the lab results as soon as they are available. The fastest way to get your results is to activate your My Chart account. Instructions are located on the last page of this paperwork. If you have not heard from us regarding the results in 2 weeks, please contact this office. °  ° ° ° °

## 2019-08-25 NOTE — Progress Notes (Signed)
12/1/20202:35 PM  Jennifer Rhodes [birth year], 58 y.o., female [id number]  Chief Complaint  Patient presents with  . Diabetes    taking whole tab of the gliperide, says it works better. Needs refill on that med    HPI:   Patient is a 58 y.o. female with past medical history significant for DM2, HLP, depression who presents today for routine followup  Last OV oct 2020 - telemedicine Stopped januvia, stared glipizide 58m BID  She has been on disability for about 3 1/2 years due to her scleroderma, she cant really lift or move much, any repetitive motions such as doing laundry or dishes causes her pain and body ache Needs form for student loan  Checking cbgs at home This morning 205, last night 302 7 days avg 193  this is off meds  [facility] She states her feet are always cold  Denies any pain or skin changes Denies any numbness or tingling Has upcoming appt with vasc surg  Depression is well controlled on celexa  Lab Results  Component Value Date   HGBA1C 8.3 07/30/2019   HGBA1C 10.3 (H) 07/16/2018   HGBA1C 8.2 (A) 04/01/2018   Lab Results  Component Value Date   LDLCALC 50 12/27/2017   CREATININE 0.62 07/16/2018    Depression screen PHQ 2/9 08/25/2019 08/04/2019 07/21/2019  Decreased Interest 0 0 2  Down, Depressed, Hopeless 0 0 2  PHQ - 2 Score 0 0 4  Altered sleeping - - 3  Tired, decreased energy - - 1  Change in appetite - - 3  Feeling bad or failure about yourself  - - 1  Trouble concentrating - - 1  Moving slowly or fidgety/restless - - 0  Suicidal thoughts - - 1  PHQ-9 Score - - 14  Difficult doing work/chores - - Not difficult at all    Fall Risk  08/25/2019 08/04/2019 07/21/2019 01/16/2019 04/01/2018  Falls in the past year? 0 0 0 0 No  Number falls in past yr: 0 0 0 0 -  Injury with Fall? 0 0 0 0 -  Follow up - Falls evaluation completed;Education provided - - -     Allergies  Allergen Reactions  . Aspirin   .  Jardiance [Empagliflozin] Other (See Comments)    abd pain and dry mouth    Prior to Admission medications   Medication Sig Start Date End Date Taking? Authorizing Provider  aluminum-magnesium hydroxide-simethicone (MAALOX) 2703-500-93MG/5ML SUSP Take 30 mLs by mouth 4 (four) times daily -  before meals and at bedtime.   Yes [provider]  atorvastatin (LIPITOR) 20 MG tablet Take 1 tablet (20 mg total) by mouth daily. 07/21/19  Yes SRutherford Guys MD  blood glucose meter kit and supplies Per insurance preference. Check blood glucose once a day. Dx E11.65 07/21/19  Yes SRutherford Guys MD  citalopram (CELEXA) 20 MG tablet Take 1 tablet (20 mg total) by mouth daily. 07/21/19  Yes SRutherford Guys MD  glipiZIDE (GLUCOTROL) 5 MG tablet Take 0.5 tablets (2.5 mg total) by mouth 2 (two) times daily before a meal. 07/30/19  Yes SRutherford Guys MD  metFORMIN (GLUCOPHAGE) 1000 MG tablet Take 1 tablet (1,000 mg total) by mouth 2 (two) times daily with a meal. 07/21/19  Yes SRutherford Guys MD    Past Medical History:  Diagnosis Date  . Anxiety   . Cataract   . Depression   . Diabetes mellitus without  complication ([place])   . History of colonic polyps   . Scleroderma [facility])     Past Surgical History:  Procedure Laterality Date  . CHOLECYSTECTOMY    . colonoscopy  2017   + polyps, repeat in 5 years    Social History   Tobacco Use  . Smoking status: Never Smoker  . Smokeless tobacco: Never Used  Substance Use Topics  . Alcohol use: Never    Frequency: Never    Family History  Problem Relation Age of Onset  . Diabetes Mother   . Mental illness Mother   . Cancer Father     Review of Systems  Constitutional: Negative for chills and fever.  Respiratory: Negative for cough and shortness of breath.   Cardiovascular: Negative for chest pain, palpitations and leg swelling.  Gastrointestinal: Negative for abdominal pain, nausea and vomiting.     OBJECTIVE:  Today's  Vitals   08/25/19 1426  BP: 124/60  Pulse: 89  Temp: (!) 96.4 F (35.8 C)  SpO2: 98%  Weight: 179 lb (81.2 kg)  Height: 5' 9"  (1.753 m)   Body mass index is 26.43 kg/m.   Physical Exam Vitals signs and nursing note reviewed.  Constitutional:      Appearance: She is well-developed.  HENT:     Head: Normocephalic and atraumatic.     Mouth/Throat:     Pharynx: No oropharyngeal exudate.  Eyes:     General: No scleral icterus.    Conjunctiva/sclera: Conjunctivae normal.     Pupils: Pupils are equal, round, and reactive to light.  Neck:     Musculoskeletal: Neck supple.  Cardiovascular:     Rate and Rhythm: Normal rate and regular rhythm.     Heart sounds: Normal heart sounds. No murmur. No friction rub. No gallop.   Pulmonary:     Effort: Pulmonary effort is normal.     Breath sounds: Normal breath sounds. No wheezing or rales.  Skin:    General: Skin is warm and dry.  Neurological:     Mental Status: She is alert and oriented to person, place, and time.     Diabetic Foot Form - Detailed   Diabetic Foot Exam - detailed Diabetic Foot exam was performed with the following findings: Yes 08/25/2019  2:57 PM  Visual Foot Exam completed.: Yes  Can the patient see the bottom of their feet?: Yes Are the shoes appropriate in style and fit?: Yes Is there swelling or and abnormal foot shape?: No Is there a claw toe deformity?: No Is there elevated skin temparature?: No Is there foot or ankle muscle weakness?: No Normal Range of Motion: No Pulse Foot Exam completed.: Yes   Left posterior Tibialias: Diminished  Right Dorsalis Pedis: Absent Left Dorsalis Pedis: Diminished  Semmes-Weinstein Monofilament Test R Site 1-Great Toe: Pos L Site 1-Great Toe: Pos        No results found for this or any previous visit (from the past 24 hour(s)).  No results found.   ASSESSMENT and PLAN  1. Type 2 diabetes mellitus with hyperglycemia, without long-term current use of insulin  (HCC) Improved, recent increase in glipizide to 5m BID. Cont current regime for now. Re-eval at next OV - CBC - Lipid panel - TSH - Microalbumin/Creatinine Ratio, Urine - Comprehensive metabolic panel  2. Recurrent major depressive disorder, in full remission ([place] Stable. Cont current regime  3. Scleroderma ([place] On disability since 2017. Forms completed  4. Abnormal ankle brachial index (ABI) Has upcoming appt with  vasc surg  Other orders - Pneumovax (PPSV23) - glipiZIDE (GLUCOTROL) 5 MG tablet; Take 1 tablet (5 mg total) by mouth 2 (two) times daily before a meal.  Return in about 3 months (around 11/23/2019).    Rutherford Guys, MD [facility], [place] [address] Ph.  [phone] Fax [phone]

## 2019-08-25 NOTE — Telephone Encounter (Signed)
Fax sent to Dept [employer] Ed to [phone]  For loan forgiveness due to disability

## 2019-08-26 LAB — MICROALBUMIN / CREATININE URINE RATIO
Creatinine, Urine: 91.5 mg/dL
Microalb/Creat Ratio: <3 mg/g creat (ref 0–29)
Microalbumin, Urine: <3 ug/mL

## 2019-08-26 LAB — CBC
Hematocrit: 38.2 % (ref 34.0–46.6)
Hemoglobin: 12.3 g/dL (ref 11.1–15.9)
MCH: 28.1 pg (ref 26.6–33.0)
MCHC: 32.2 g/dL (ref 31.5–35.7)
MCV: 87 fL (ref 79–97)
Platelets: 323 10*3/uL (ref 150–450)
RBC: 4.38 x10E6/uL (ref 3.77–5.28)
RDW: 12.4 % (ref 11.7–15.4)
WBC: 5.7 10*3/uL (ref 3.4–10.8)

## 2019-08-26 LAB — COMPREHENSIVE METABOLIC PANEL
ALT: 32 IU/L (ref 0–32)
AST: 21 IU/L (ref 0–40)
Albumin/Globulin Ratio: 1.6 (ref 1.2–2.2)
Albumin: 4.2 g/dL (ref 3.8–4.9)
Alkaline Phosphatase: 64 IU/L (ref 39–117)
BUN/Creatinine Ratio: 16 (ref 12–28)
BUN: 10 mg/dL (ref 8–27)
Bilirubin Total: 0.4 mg/dL (ref 0.0–1.2)
CO2: 25 mmol/L (ref 20–29)
Calcium: 9.2 mg/dL (ref 8.7–10.3)
Chloride: 102 mmol/L (ref 96–106)
Creatinine, Ser: 0.61 mg/dL (ref 0.57–1.00)
GFR calc Af Amer: 114 mL/min/{1.73_m2} (ref >59)
GFR calc non Af Amer: 99 mL/min/{1.73_m2} (ref >59)
Globulin, Total: 2.6 g/dL (ref 1.5–4.5)
Glucose: 187 mg/dL — ABNORMAL HIGH (ref 65–99)
Potassium: 4.3 mmol/L (ref 3.5–5.2)
Sodium: 141 mmol/L (ref 134–144)
Total Protein: 6.8 g/dL (ref 6.0–8.5)

## 2019-08-26 LAB — LIPID PANEL
Chol/HDL Ratio: 2.3 ratio (ref 0.0–4.4)
Cholesterol, Total: 99 mg/dL — ABNORMAL LOW (ref 100–199)
HDL: 44 mg/dL (ref >39)
LDL Chol Calc (NIH): 42 mg/dL (ref 0–99)
Triglycerides: 53 mg/dL (ref 0–149)
VLDL Cholesterol Cal: 13 mg/dL (ref 5–40)

## 2019-08-26 LAB — HEMOGLOBIN A1C
Est. average glucose Bld gHb Est-mCnc: 217 mg/dL
Hgb A1c MFr Bld: 9.2 % — ABNORMAL HIGH (ref 4.8–5.6)

## 2019-08-26 LAB — TSH: TSH: 1.46 u[IU]/mL (ref 0.450–4.500)

## 2019-08-27 NOTE — Addendum Note (Signed)
Addended by: Rutherford Guys on: 08/27/2019 08:56 AM   Modules accepted: Orders

## 2019-08-28 ENCOUNTER — Encounter: Payer: Self-pay | Admitting: Radiology

## 2019-09-16 ENCOUNTER — Other Ambulatory Visit: Payer: Self-pay | Admitting: Family Medicine

## 2019-09-16 NOTE — Telephone Encounter (Signed)
Forwarding medication refill request to the clinical pool for review. 

## 2019-09-29 ENCOUNTER — Encounter: Payer: Medicare PPO | Admitting: Vascular Surgery

## 2019-09-29 ENCOUNTER — Encounter (HOSPITAL_COMMUNITY): Payer: Medicare PPO

## 2019-10-26 ENCOUNTER — Other Ambulatory Visit: Payer: Self-pay

## 2019-10-26 DIAGNOSIS — I739 Peripheral vascular disease, unspecified: Secondary | ICD-10-CM

## 2019-10-27 ENCOUNTER — Ambulatory Visit (HOSPITAL_COMMUNITY)
Admission: RE | Admit: 2019-10-27 | Discharge: 2019-10-27 | Disposition: A | Discharge status: Home / Self Care | Payer: Medicare PPO | Source: Ambulatory Visit | Attending: Vascular Surgery | Admitting: Vascular Surgery

## 2019-10-27 ENCOUNTER — Encounter: Payer: Self-pay | Admitting: Vascular Surgery

## 2019-10-27 ENCOUNTER — Other Ambulatory Visit: Payer: Self-pay

## 2019-10-27 ENCOUNTER — Ambulatory Visit (INDEPENDENT_AMBULATORY_CARE_PROVIDER_SITE_OTHER): Payer: Medicare PPO | Admitting: Vascular Surgery

## 2019-10-27 DIAGNOSIS — M79605 Pain in left leg: Secondary | ICD-10-CM | POA: Diagnosis not present

## 2019-10-27 DIAGNOSIS — I739 Peripheral vascular disease, unspecified: Secondary | ICD-10-CM | POA: Insufficient documentation

## 2019-10-27 DIAGNOSIS — M79604 Pain in right leg: Secondary | ICD-10-CM | POA: Diagnosis not present

## 2019-10-27 DIAGNOSIS — M79606 Pain in leg, unspecified: Secondary | ICD-10-CM | POA: Insufficient documentation
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
## 2019-11-18 ENCOUNTER — Other Ambulatory Visit: Payer: Self-pay | Admitting: Family Medicine

## 2019-11-24 ENCOUNTER — Other Ambulatory Visit: Payer: Self-pay

## 2019-11-24 ENCOUNTER — Encounter: Payer: Self-pay | Admitting: Family Medicine

## 2019-11-24 ENCOUNTER — Ambulatory Visit (INDEPENDENT_AMBULATORY_CARE_PROVIDER_SITE_OTHER): Payer: Medicare PPO | Admitting: Family Medicine

## 2019-11-24 VITALS — BP 130/77 | HR 85 | Temp 98.3°F | Ht 69.0 in | Wt 184.6 lb

## 2019-11-24 DIAGNOSIS — E785 Hyperlipidemia, unspecified: Secondary | ICD-10-CM | POA: Insufficient documentation

## 2019-11-24 DIAGNOSIS — Z1231 Encounter for screening mammogram for malignant neoplasm of breast: Secondary | ICD-10-CM | POA: Diagnosis not present

## 2019-11-24 DIAGNOSIS — E1169 Type 2 diabetes mellitus with other specified complication: Secondary | ICD-10-CM

## 2019-11-24 DIAGNOSIS — I10 Essential (primary) hypertension: Secondary | ICD-10-CM | POA: Diagnosis not present

## 2019-11-24 DIAGNOSIS — E1165 Type 2 diabetes mellitus with hyperglycemia: Secondary | ICD-10-CM | POA: Diagnosis not present

## 2019-11-24 DIAGNOSIS — F330 Major depressive disorder, recurrent, mild: Secondary | ICD-10-CM | POA: Diagnosis not present

## 2019-11-24 LAB — POCT GLYCOSYLATED HEMOGLOBIN (HGB A1C): Hemoglobin A1C: 7.8 % — AB (ref 4.0–5.6)

## 2019-11-24 MED ORDER — CITALOPRAM HYDROBROMIDE 20 MG PO TABS: 20.0000 mg | ORAL_TABLET | Freq: Every day | ORAL | 3 refills | Status: DC

## 2019-11-24 MED ORDER — ATORVASTATIN CALCIUM 20 MG PO TABS: 20.0000 mg | ORAL_TABLET | Freq: Every day | ORAL | 3 refills | Status: DC

## 2019-11-24 MED ORDER — GLUCOSE BLOOD VI STRP: ORAL_STRIP | 3 refills | Status: DC

## 2019-11-24 MED ORDER — GLIPIZIDE 5 MG PO TABS: 5.0000 mg | ORAL_TABLET | Freq: Two times a day (BID) | ORAL | 1 refills | Status: DC

## 2019-11-24 MED ORDER — METFORMIN HCL 1000 MG PO TABS: ORAL_TABLET | ORAL | 1 refills | Status: DC

## 2019-11-24 NOTE — Progress Notes (Signed)
3/2/202111:09 AM  Jennifer Rhodes 06-21-1961, 59 y.o., female [id number]  Chief Complaint  Patient presents with  . Diabetes    needing refills on dm supplies, strips only    HPI:   Patient is a 59 y.o. female with past medical history significant for DM2, HLP, HTN, depression who presents today for routine followup  Last OV dec 2020 - no changes Saw vasc surg feb 2021 - no PAD  Checking her cbgs BID morning and dinner (usually before) Denies any lows, sometimes has cbgs 200 if she eats out 7 day avg 166 14 day avg 170 30 day avg 177  Lab Results  Component Value Date   HGBA1C 7.8 (A) 11/24/2019   HGBA1C 9.2 (H) 08/25/2019   HGBA1C 8.3 07/30/2019   Lab Results  Component Value Date   LDLCALC 42 08/25/2019   CREATININE 0.61 08/25/2019    Started walking at the mall to help with exercise and get out of the house Feels that depression is mostly from being socially isolated She does not wan to make any changes at this time  Depression screen [facility] 2/9 11/24/2019 08/25/2019 08/04/2019  Decreased Interest 1 0 0  Down, Depressed, Hopeless 2 0 0  PHQ - 2 Score 3 0 0  Altered sleeping 3 - -  Tired, decreased energy 1 - -  Change in appetite 2 - -  Feeling bad or failure about yourself  1 - -  Trouble concentrating 2 - -  Moving slowly or fidgety/restless 1 - -  Suicidal thoughts 0 - -  PHQ-9 Score 13 - -  Difficult doing work/chores Not difficult at all - -   GAD 7 : Generalized Anxiety Score 11/24/2019  Nervous, Anxious, on Edge 1  Control/stop worrying 1  Worry too much - different things 1  Trouble relaxing 1  Restless 1  Easily annoyed or irritable 1  Afraid - awful might happen 0  Total GAD 7 Score 6  Anxiety Difficulty Somewhat difficult     Fall Risk  11/24/2019 11/24/2019 08/25/2019 08/04/2019 07/21/2019  Falls in the past year? 0 0 0 0 0  Number falls in past yr: 0 0 0 0 0  Injury with Fall? 0 0 0 0 0  Follow up - - - Falls evaluation completed;Education  provided -     Allergies  Allergen Reactions  . Aspirin   . Jardiance [Empagliflozin] Other (See Comments)    abd pain and dry mouth    Prior to Admission medications   Medication Sig Start Date End Date Taking? Authorizing Provider  aluminum-magnesium hydroxide-simethicone (MAALOX) [phone] MG/5ML SUSP Take 30 mLs by mouth 4 (four) times daily -  before meals and at bedtime.   Yes [provider]  atorvastatin (LIPITOR) 20 MG tablet Take 1 tablet (20 mg total) by mouth daily. 07/21/19  Yes Rutherford Guys, MD  blood glucose meter kit and supplies Per insurance preference. Check blood glucose once a day. Dx E11.65 07/21/19  Yes Rutherford Guys, MD  citalopram (CELEXA) 20 MG tablet Take 1 tablet (20 mg total) by mouth daily. 07/21/19  Yes Rutherford Guys, MD  glipiZIDE (GLUCOTROL) 5 MG tablet Take 1 tablet (5 mg total) by mouth 2 (two) times daily before a meal. 08/25/19  Yes Rutherford Guys, MD  metFORMIN (GLUCOPHAGE) 1000 MG tablet TAKE 1 TABLET WITH A MEAL TWICE DAILY 09/17/19  Yes Rutherford Guys, MD    Past Medical History:  Diagnosis Date  .  Anxiety   . Cataract   . Depression   . Diabetes mellitus without complication ([place])   . History of colonic polyps   . Scleroderma [facility])     Past Surgical History:  Procedure Laterality Date  . CHOLECYSTECTOMY    . colonoscopy  2017   + polyps, repeat in 5 years    Social History   Tobacco Use  . Smoking status: Never Smoker  . Smokeless tobacco: Never Used  Substance Use Topics  . Alcohol use: Never    Family History  Problem Relation Age of Onset  . Diabetes Mother   . Mental illness Mother   . Cancer Father     Review of Systems  Constitutional: Negative for chills and fever.  Respiratory: Negative for cough and shortness of breath.   Cardiovascular: Negative for chest pain, palpitations and leg swelling.  Gastrointestinal: Negative for abdominal pain, nausea and vomiting.     OBJECTIVE:   Today's Vitals   11/24/19 1055  BP: 130/77  Pulse: 85  Temp: 98.3 F (36.8 C)  SpO2: 100%  Weight: 184 lb 9.6 oz (83.7 kg)  Height: 5' 9"  (1.753 m)   Body mass index is 27.26 kg/m.   Physical Exam Vitals and nursing note reviewed.  Constitutional:      Appearance: She is well-developed.  HENT:     Head: Normocephalic and atraumatic.     Mouth/Throat:     Pharynx: No oropharyngeal exudate.  Eyes:     General: No scleral icterus.    Conjunctiva/sclera: Conjunctivae normal.     Pupils: Pupils are equal, round, and reactive to light.  Cardiovascular:     Rate and Rhythm: Normal rate and regular rhythm.     Heart sounds: Normal heart sounds. No murmur. No friction rub. No gallop.   Pulmonary:     Effort: Pulmonary effort is normal.     Breath sounds: Normal breath sounds. No wheezing or rales.  Musculoskeletal:     Cervical back: Neck supple.     Right lower leg: No edema.     Left lower leg: No edema.  Skin:    General: Skin is warm and dry.  Neurological:     Mental Status: She is alert and oriented to person, place, and time.     No results found for this or any previous visit (from the past 24 hour(s)).  No results found.   ASSESSMENT and PLAN  1. Type 2 diabetes mellitus with hyperglycemia, without long-term current use of insulin (HCC) Improved. Cont current regime and LFM - POCT glycosylated hemoglobin (Hb A1C) - Lipid panel - CMP14+EGFR  2. Essential hypertension, benign Controlled. Continue current regime.   3. Hyperlipidemia associated with type 2 diabetes mellitus ([place]) Checking labs today, medications will be adjusted as needed.   4. Mild episode of recurrent major depressive disorder (HCC) Stable. Cont current regime.  - citalopram (CELEXA) 20 MG tablet; Take 1 tablet (20 mg total) by mouth daily.  5. Visit for screening mammogram - MM Digital Screening; Future  Other orders - glucose blood test strip; ACCU-CHEK. Use to test blood sugar  once a day, alternate fasting with 2 hours after dinner.  Dx E11.9 - atorvastatin (LIPITOR) 20 MG tablet; Take 1 tablet (20 mg total) by mouth daily. - glipiZIDE (GLUCOTROL) 5 MG tablet; Take 1 tablet (5 mg total) by mouth 2 (two) times daily before a meal. - metFORMIN (GLUCOPHAGE) 1000 MG tablet; TAKE 1 TABLET WITH A MEAL TWICE DAILY  Return in about 3 months (around 02/24/2020).    Rutherford Guys, MD Primary Care at [address], [place] [address] Ph.  [phone] Fax [phone]

## 2019-11-24 NOTE — Patient Instructions (Signed)
° ° ° °  If you have lab work done today you will be contacted with your lab results within the next 2 weeks.  If you have not heard from us then please contact us. The fastest way to get your results is to register for My Chart. ° ° °IF you received an x-ray today, you will receive an invoice from [facility]. Please contact [facility] at [phone] with questions or concerns regarding your invoice.  ° °IF you received labwork today, you will receive an invoice from [facility]. Please contact [facility] at [phone] with questions or concerns regarding your invoice.  ° °Our billing staff will not be able to assist you with questions regarding bills from these companies. ° °You will be contacted with the lab results as soon as they are available. The fastest way to get your results is to activate your My Chart account. Instructions are located on the last page of this paperwork. If you have not heard from us regarding the results in 2 weeks, please contact this office. °  ° ° ° °

## 2019-11-25 LAB — CMP14+EGFR
ALT: 20 IU/L (ref 0–32)
AST: 15 IU/L (ref 0–40)
Albumin/Globulin Ratio: 1.6 (ref 1.2–2.2)
Albumin: 4 g/dL (ref 3.8–4.9)
Alkaline Phosphatase: 68 IU/L (ref 39–117)
BUN/Creatinine Ratio: 22 (ref 12–28)
BUN: 14 mg/dL (ref 8–27)
Bilirubin Total: 0.4 mg/dL (ref 0.0–1.2)
CO2: 28 mmol/L (ref 20–29)
Calcium: 9.6 mg/dL (ref 8.7–10.3)
Chloride: 100 mmol/L (ref 96–106)
Creatinine, Ser: 0.63 mg/dL (ref 0.57–1.00)
GFR calc Af Amer: 113 mL/min/{1.73_m2} (ref >59)
GFR calc non Af Amer: 98 mL/min/{1.73_m2} (ref >59)
Globulin, Total: 2.5 g/dL (ref 1.5–4.5)
Glucose: 314 mg/dL — ABNORMAL HIGH (ref 65–99)
Potassium: 4.1 mmol/L (ref 3.5–5.2)
Sodium: 142 mmol/L (ref 134–144)
Total Protein: 6.5 g/dL (ref 6.0–8.5)

## 2019-11-25 LAB — LIPID PANEL
Chol/HDL Ratio: 2.1 ratio (ref 0.0–4.4)
Cholesterol, Total: 112 mg/dL (ref 100–199)
HDL: 54 mg/dL (ref >39)
LDL Chol Calc (NIH): 47 mg/dL (ref 0–99)
Triglycerides: 40 mg/dL (ref 0–149)
VLDL Cholesterol Cal: 11 mg/dL (ref 5–40)

## 2020-02-01 ENCOUNTER — Encounter: Payer: Medicare PPO | Admitting: Emergency Medicine

## 2020-02-25 ENCOUNTER — Ambulatory Visit: Payer: Medicare PPO | Admitting: Family Medicine

## 2020-04-01 ENCOUNTER — Ambulatory Visit (INDEPENDENT_AMBULATORY_CARE_PROVIDER_SITE_OTHER): Payer: Medicare PPO | Admitting: Family Medicine

## 2020-04-01 ENCOUNTER — Other Ambulatory Visit: Payer: Self-pay

## 2020-04-01 ENCOUNTER — Encounter: Payer: Self-pay | Admitting: Family Medicine

## 2020-04-01 DIAGNOSIS — F330 Major depressive disorder, recurrent, mild: Secondary | ICD-10-CM | POA: Diagnosis not present

## 2020-04-01 DIAGNOSIS — E1165 Type 2 diabetes mellitus with hyperglycemia: Secondary | ICD-10-CM | POA: Diagnosis not present

## 2020-04-01 MED ORDER — GLIPIZIDE 10 MG PO TABS: 10.0000 mg | ORAL_TABLET | Freq: Two times a day (BID) | ORAL | 1 refills | Status: DC

## 2020-04-01 MED ORDER — CITALOPRAM HYDROBROMIDE 40 MG PO TABS: 40.0000 mg | ORAL_TABLET | Freq: Every day | ORAL | 1 refills | Status: DC

## 2020-04-01 MED ORDER — METFORMIN HCL 1000 MG PO TABS: ORAL_TABLET | ORAL | 1 refills | Status: DC

## 2020-04-01 NOTE — Patient Instructions (Signed)
° ° ° °  If you have lab work done today you will be contacted with your lab results within the next 2 weeks.  If you have not heard from us then please contact us. The fastest way to get your results is to register for My Chart. ° ° °IF you received an x-ray today, you will receive an invoice from [facility]. Please contact  Radiology at [phone] with questions or concerns regarding your invoice.  ° °IF you received labwork today, you will receive an invoice from [facility]. Please contact [facility] at [phone] with questions or concerns regarding your invoice.  ° °Our billing staff will not be able to assist you with questions regarding bills from these companies. ° °You will be contacted with the lab results as soon as they are available. The fastest way to get your results is to activate your My Chart account. Instructions are located on the last page of this paperwork. If you have not heard from us regarding the results in 2 weeks, please contact this office. °  ° ° ° °

## 2020-04-01 NOTE — Progress Notes (Signed)
7/9/202110:24 AM  Jennifer Rhodes 01/17/1961, 59 y.o., female [id number]  Chief Complaint  Patient presents with  . Medical Management of Chronic Issues    f/u   . Diabetes    HPI:   Patient is a 59 y.o. female with past medical history significant for DM2, HLP, depression, sclerodermawho presents today for routine followup  Last OV March 2021 - no changes  She is overall doing ok except for having higher cbgs She checks cbg twice a day, fasting and before dinner Brings in glucometer for review Her range 137-250 Her 7 day fasting avg 208 Her 7 day AC dinner avg 176 No lows noted, denies sx She is taking metformin 1030m BID and glipizide 530mBID  She otherwise feels depression is not well controlled Spends most days feeling down, not interested in doing things Her sleep has also not been well She is struggling with not working   Lab Results  Component Value Date   HGBA1C 7.8 (A) 11/24/2019   HGBA1C 9.2 (H) 08/25/2019   HGBA1C 8.3 07/30/2019   Lab Results  Component Value Date   LDLCALC 47 11/24/2019   CREATININE 0.63 11/24/2019    Depression screen PHQ 2/9 04/01/2020 11/24/2019 08/25/2019  Decreased Interest 1 1 0  Down, Depressed, Hopeless 1 2 0  PHQ - 2 Score 2 3 0  Altered sleeping - 3 -  Tired, decreased energy - 1 -  Change in appetite - 2 -  Feeling bad or failure about yourself  - 1 -  Trouble concentrating - 2 -  Moving slowly or fidgety/restless - 1 -  Suicidal thoughts - 0 -  PHQ-9 Score - 13 -  Difficult doing work/chores - Not difficult at all -    Fall Risk  04/01/2020 11/24/2019 11/24/2019 08/25/2019 08/04/2019  Falls in the past year? 0 0 0 0 0  Number falls in past yr: 0 0 0 0 0  Injury with Fall? 0 0 0 0 0  Follow up Falls evaluation completed - - - Falls evaluation completed;Education provided     Allergies  Allergen Reactions  . Aspirin   . Jardiance [Empagliflozin] Other (See Comments)    abd pain and dry mouth    Prior to Admission  medications   Medication Sig Start Date End Date Taking? Authorizing Provider  Accu-Chek Softclix Lancets lancets  02/16/20  Yes [provider]  aluminum-magnesium hydroxide-simethicone (MAALOX) 20409-811-91G/5ML SUSP Take 30 mLs by mouth 4 (four) times daily -  before meals and at bedtime.   Yes [provider]  atorvastatin (LIPITOR) 20 MG tablet Take 1 tablet (20 mg total) by mouth daily. 11/24/19  Yes SaRutherford GuysMD  blood glucose meter kit and supplies Per insurance preference. Check blood glucose once a day. Dx E11.65 07/21/19  Yes SaRutherford GuysMD  citalopram (CELEXA) 20 MG tablet Take 1 tablet (20 mg total) by mouth daily. 11/24/19  Yes SaRutherford GuysMD  glipiZIDE (GLUCOTROL) 5 MG tablet Take 1 tablet (5 mg total) by mouth 2 (two) times daily before a meal. 11/24/19  Yes SaRutherford GuysMD  glucose blood test strip ACCU-CHEK. Use to test blood sugar once a day, alternate fasting with 2 hours after dinner.  Dx E11.9 11/24/19  Yes SaRutherford GuysMD  metFORMIN (GLUCOPHAGE) 1000 MG tablet TAKE 1 TABLET WITH A MEAL TWICE DAILY 11/24/19  Yes SaRutherford GuysMD    Past Medical History:  Diagnosis Date  .  Anxiety   . Cataract   . Depression   . Diabetes mellitus without complication ([place])   . History of colonic polyps   . Scleroderma [facility])     Past Surgical History:  Procedure Laterality Date  . CHOLECYSTECTOMY    . colonoscopy  2017   + polyps, repeat in 5 years    Social History   Tobacco Use  . Smoking status: Never Smoker  . Smokeless tobacco: Never Used  Substance Use Topics  . Alcohol use: Never    Family History  Problem Relation Age of Onset  . Diabetes Mother   . Mental illness Mother   . Cancer Father     Review of Systems  Constitutional: Negative for chills and fever.  Respiratory: Negative for cough and shortness of breath.   Cardiovascular: Negative for chest pain, palpitations and leg swelling.  Gastrointestinal: Negative  for abdominal pain, nausea and vomiting.   Per hpi  OBJECTIVE:  Today's Vitals   04/01/20 1019  BP: 136/70  Pulse: 94  Temp: 98 F (36.7 C)  TempSrc: Temporal  SpO2: 99%  Weight: 187 lb 3.2 oz (84.9 kg)  Height: _0  (1.753 m)   Body mass index is 27.64 kg/m.   Wt Readings from Last 3 Encounters:  04/01/20 187 lb 3.2 oz (84.9 kg)  11/24/19 184 lb 9.6 oz (83.7 kg)  10/27/19 179 lb (81.2 kg)     Physical Exam Vitals and nursing note reviewed.  Constitutional:      Appearance: She is well-developed.  HENT:     Head: Normocephalic and atraumatic.     Mouth/Throat:     Pharynx: No oropharyngeal exudate.  Eyes:     General: No scleral icterus.    Conjunctiva/sclera: Conjunctivae normal.     Pupils: Pupils are equal, round, and reactive to light.  Cardiovascular:     Rate and Rhythm: Normal rate and regular rhythm.     Heart sounds: Normal heart sounds. No murmur heard.  No friction rub. No gallop.   Pulmonary:     Effort: Pulmonary effort is normal.     Breath sounds: Normal breath sounds. No wheezing or rales.  Musculoskeletal:     Cervical back: Neck supple.  Skin:    General: Skin is warm and dry.  Neurological:     Mental Status: She is alert and oriented to person, place, and time.     No results found for this or any previous visit (from the past 24 hour(s)).  No results found.   ASSESSMENT and PLAN  1. Type 2 diabetes mellitus with hyperglycemia, without long-term current use of insulin (HCC) cbgs not at goal. Discussed options, patient would like to increase glipizide to 72m BID. Reviewed r/se/b, discussed importance of LFM. Referring to CDE - Comprehensive metabolic panel - Hemoglobin A1c - Ambulatory referral to diabetic education  2. Mild episode of recurrent major depressive disorder ([place] Not controlled. Increase celexa to 441mdaily. Discussed behavior activation, declines counseling at this time - citalopram (CELEXA) 40 MG tablet; Take  1 tablet (40 mg total) by mouth daily.  Other orders - Accu-Chek Softclix Lancets lancets - glipiZIDE (GLUCOTROL) 10 MG tablet; Take 1 tablet (10 mg total) by mouth 2 (two) times daily before a meal. - metFORMIN (GLUCOPHAGE) 1000 MG tablet; TAKE 1 TABLET WITH A MEAL TWICE DAILY  Return in about 3 months (around 07/02/2020).    IrRutherford GuysMD [facility] [place] [id number].  [phone] Fax [phone]

## 2020-04-02 LAB — COMPREHENSIVE METABOLIC PANEL
ALT: 20 IU/L (ref 0–32)
AST: 14 IU/L (ref 0–40)
Albumin/Globulin Ratio: 1.3 (ref 1.2–2.2)
Albumin: 4 g/dL (ref 3.8–4.8)
Alkaline Phosphatase: 72 IU/L (ref 48–121)
BUN/Creatinine Ratio: 19 (ref 12–28)
BUN: 12 mg/dL (ref 8–27)
Bilirubin Total: 0.5 mg/dL (ref 0.0–1.2)
CO2: 25 mmol/L (ref 20–29)
Calcium: 9.6 mg/dL (ref 8.7–10.3)
Chloride: 103 mmol/L (ref 96–106)
Creatinine, Ser: 0.63 mg/dL (ref 0.57–1.00)
GFR calc Af Amer: 112 mL/min/{1.73_m2} (ref >59)
GFR calc non Af Amer: 97 mL/min/{1.73_m2} (ref >59)
Globulin, Total: 3.1 g/dL (ref 1.5–4.5)
Glucose: 257 mg/dL — ABNORMAL HIGH (ref 65–99)
Potassium: 4.1 mmol/L (ref 3.5–5.2)
Sodium: 143 mmol/L (ref 134–144)
Total Protein: 7.1 g/dL (ref 6.0–8.5)

## 2020-04-02 LAB — HEMOGLOBIN A1C
Est. average glucose Bld gHb Est-mCnc: 192 mg/dL
Hgb A1c MFr Bld: 8.3 % — ABNORMAL HIGH (ref 4.8–5.6)

## 2020-04-06 ENCOUNTER — Other Ambulatory Visit: Payer: Self-pay | Admitting: Family Medicine

## 2020-04-06 ENCOUNTER — Encounter: Payer: Self-pay | Admitting: Radiology

## 2020-04-11 ENCOUNTER — Other Ambulatory Visit: Payer: Self-pay | Admitting: Family Medicine

## 2020-04-11 NOTE — Telephone Encounter (Signed)
Requested medications are due for refill today?  Unknown - listed as historical   Requested medications are on active medication list?  Yes - as historical  Last Refill:  Unknown  Future visit scheduled?  No   Notes to Clinic:  Unable to fill historical medications / supplies.

## 2020-04-18 ENCOUNTER — Other Ambulatory Visit: Payer: Self-pay

## 2020-04-18 DIAGNOSIS — E1165 Type 2 diabetes mellitus with hyperglycemia: Secondary | ICD-10-CM

## 2020-04-18 MED ORDER — GLIPIZIDE 10 MG PO TABS: 10.0000 mg | ORAL_TABLET | Freq: Two times a day (BID) | ORAL | 1 refills | Status: DC

## 2020-04-21 ENCOUNTER — Other Ambulatory Visit: Payer: Self-pay | Admitting: Family Medicine

## 2020-05-24 ENCOUNTER — Encounter: Payer: Medicare PPO | Attending: Family Medicine | Admitting: Dietician

## 2020-05-24 ENCOUNTER — Other Ambulatory Visit: Payer: Self-pay

## 2020-05-24 ENCOUNTER — Encounter: Payer: Self-pay | Admitting: Dietician

## 2020-05-24 DIAGNOSIS — E1165 Type 2 diabetes mellitus with hyperglycemia: Secondary | ICD-10-CM | POA: Insufficient documentation

## 2020-05-24 NOTE — Patient Instructions (Addendum)
It was nice meeting you today!   Remember to fill half your plate with non-starchy vegetables, 1/4 with lean protein, and 1/4 with carbohydrates.   The more fiber and/or less processed a food is, the better. Some examples include whole grain vs white bread, brown vs white rice, and apples vs apple juice.   Continue to stay physically active throughout the day/week, great job with this!  Try any stress management techniques or relaxing hobbies you have, as this can help with your blood sugars as well.

## 2020-05-24 NOTE — Progress Notes (Signed)
Diabetes Self-Management Education  Visit Type: First/Initial  Appt. Start Time: 10:55am   Appt. End Time: 11:30am  05/24/2020  Ms. Jennifer Rhodes, identified by name and date of birth, is a 59 y.o. female with a diagnosis of Diabetes: Type 2.    ASSESSMENT  Patient states she would like more guidance with which foods to eat and if she needs vitamin supplements. States she currently takes vitamin C and a MVI. Typical meal pattern is 3 meals per day plus maybe a snack during the day. Drinks primarily water and diet tea.    Diabetes Self-Management Education - 05/24/20 1100      Visit Information   Visit Type First/Initial      Initial Visit   Diabetes Type Type 2    Are you currently following a meal plan? No    Are you taking your medications as prescribed? Yes    Date Diagnosed 40 years ago      Health Coping   How would you rate your overall health? Good      Psychosocial Assessment   Patient Belief/Attitude about Diabetes Motivated to manage diabetes    Self-care barriers None    Self-management support Doctor's office    Patient Concerns Nutrition/Meal planning    Special Needs None    Preferred Learning Style No preference indicated    Learning Readiness Ready    What is the last grade level you completed in school? 12th      Complications   Last HgB A1C per patient/outside source 8.3 %   04/01/2020   How often do you check your blood sugar? 1-2 times/day    Fasting Blood glucose range (mg/dL) 160-737;106-269    Postprandial Blood glucose range (mg/dL) 485-462;703-500    Have you had a dilated eye exam in the past 12 months? Yes    Have you had a dental exam in the past 12 months? No    Are you checking your feet? Yes    How many days per week are you checking your feet? 7      Dietary Intake   Breakfast hot cakes + sausage   or bacon + egg + oatmeal w/ blueberries (or [employer]'s breakfast burritos)   Snack (morning) banana   or nuts (or sugar-free jello)   Lunch  chicken patty + air fried fries   or sandwich   Dinner fish + vegetables + rice   or teriyaki shrimp + stir fried rice (or burger + potato salad + baked beans + slaw) (or [employer]) (or Subway [place] subs) (or [employer] salad)   Beverage(s) diet tea, water      Exercise   Exercise Type Light (walking / raking leaves)    How many days per week to you exercise? 7    How many minutes per day do you exercise? 45    Total minutes per week of exercise 315      Patient Education   Previous Diabetes Education Yes (please comment)   long time ago   Disease state  Factors that contribute to the development of diabetes;Explored patient's options for treatment of their diabetes    Nutrition management  Role of diet in the treatment of diabetes and the relationship between the three main macronutrients and blood glucose level;Meal options for control of blood glucose level and chronic complications.;Information on hints to eating out and maintain blood glucose control.    Physical activity and exercise  Role of exercise on diabetes management, blood pressure control  and cardiac health.      Individualized Goals (developed by patient)   Nutrition General guidelines for healthy choices and portions discussed    Physical Activity Exercise 5-7 days per week    Medications take my medication as prescribed    Monitoring  test my blood glucose as discussed      Outcomes   Expected Outcomes Demonstrated interest in learning. Expect positive outcomes    Future DMSE PRN           Individualized Plan for Diabetes Self-Management Training:  Learning Objective:  Patient will have a greater understanding of diabetes self-management. Patient education plan is to attend individual and/or group sessions per assessed needs and concerns.   Plan:  Patient Instructions  It was nice meeting you today!   Remember to fill half your plate with non-starchy vegetables, 1/4 with lean protein, and 1/4 with carbohydrates.    The more fiber and/or less processed a food is, the better. Some examples include whole grain vs white bread, brown vs white rice, and apples vs apple juice.   Continue to stay physically active throughout the day/week, great job with this!  Try any stress management techniques or relaxing hobbies you have, as this can help with your blood sugars as well.    Expected Outcomes:  Demonstrated interest in learning. Expect positive outcomes  Education material provided: My Plate, Meal Ideas, Balanced Snacks, Breakfast Ideas  If problems or questions, patient to contact team via:  Phone and Email  Future DSME appointment: PRN

## 2020-07-06 ENCOUNTER — Other Ambulatory Visit: Payer: Self-pay | Admitting: Family Medicine

## 2020-07-06 MED ORDER — GLUCOSE BLOOD VI STRP: ORAL_STRIP | 3 refills | Status: DC

## 2020-07-06 NOTE — Telephone Encounter (Signed)
Medication Refill - Medication: glucose blood test strip   Has the patient contacted their pharmacy? No. (Agent: If no, request that the patient contact the pharmacy for the refill.) (Agent: If yes, when and what did the pharmacy advise?)  Preferred Pharmacy (with phone number or street name):  [facility] - [place], [place] - [address] Phone:  [phone]  Fax:  [phone]      Agent: Please be advised that RX refills may take up to 3 business days. We ask that you follow-up with your pharmacy.

## 2020-08-23 ENCOUNTER — Telehealth: Payer: Self-pay | Admitting: *Deleted

## 2020-08-24 ENCOUNTER — Ambulatory Visit: Payer: Medicare PPO | Admitting: Family Medicine

## 2020-08-24 VITALS — BP 136/70 | Ht 69.0 in | Wt 187.0 lb

## 2020-08-24 DIAGNOSIS — Z0000 Encounter for general adult medical examination without abnormal findings: Secondary | ICD-10-CM

## 2020-08-24 NOTE — Progress Notes (Signed)
Presents today for [employer] Visit   Date of last exam:04-01-2020   Interpreter used for this visit? No  I connected with  Elvina Sidle on 08/24/20 by a video enabled telemedicine application and verified that I am speaking with the correct person using two identifiers.   I discussed the limitations of evaluation and management by telemedicine. The patient expressed understanding and agreed to proceed.  Patient location: home  Provider location: in office  I provided 20 minutes of non face - to - face time during this encounter.  Patient Care Team: Rutherford Guys, MD (Inactive) as PCP - General (Family Medicine)   Other items to address today:   Discussed immunizations Patient will bring COVID card, get flu shot on follow up visit with [facility] on 12-7 @ 11:00 Discussed Eye/Dental    Other Screening: Last screening for diabetes: 04-01-2020 Last lipid screening: 11/24/2019  ADVANCE DIRECTIVES: Discussed: yes On File: no Materials Provided: yes  Immunization status:  Immunization History  Administered Date(s) Administered   Influenza,inj,Quad PF,6+ Mos 07/16/2018   Pneumococcal Polysaccharide-23 08/25/2019     Health Maintenance Due  Topic Date Due   COVID-19 Vaccine (1) Never done   MAMMOGRAM  02/23/2019   INFLUENZA VACCINE  04/24/2020   OPHTHALMOLOGY EXAM  07/05/2020   FOOT EXAM  08/24/2020   URINE MICROALBUMIN  08/24/2020     Functional Status Survey: Is the patient deaf or have difficulty hearing?: Yes (feeling loss of hearing in right ear) Does the patient have difficulty seeing, even when wearing glasses/contacts?: No Does the patient have difficulty concentrating, remembering, or making decisions?: No Does the patient have difficulty walking or climbing stairs?: No Does the patient have difficulty dressing or bathing?: No Does the patient have difficulty doing errands alone such as visiting a doctor's office or shopping?:  No   6CIT Screen 08/24/2020 08/04/2019  What Year? 0 points 0 points  What month? 0 points 0 points  What time? 0 points 0 points  Count back from 20 0 points 0 points  Months in reverse 0 points 0 points  Repeat phrase 0 points 2 points  Total Score 0 2        Clinical Support from 08/24/2020 in [facility]  AUDIT-C Score 0       Home Environment:  Lives in a one story home No scattered rugs No grab bars Adequate lighting/ no clutter No trouble climbing stairs    Patient Active Problem List   Diagnosis Date Noted   Hyperlipidemia associated with type 2 diabetes mellitus ([place]) 11/24/2019   Essential hypertension, benign 11/24/2019   Leg pain 10/27/2019   Scleroderma ([place]) 08/25/2019   Type 2 diabetes mellitus with hyperglycemia, without long-term current use of insulin ([place]) 01/16/2019   Recurrent major depressive disorder, in full remission ([place]) 01/16/2019     Past Medical History:  Diagnosis Date   Anxiety    Cataract    Depression    Diabetes mellitus without complication ([place])    History of colonic polyps    Scleroderma ([place])      Past Surgical History:  Procedure Laterality Date   CHOLECYSTECTOMY     colonoscopy  2017   + polyps, repeat in 5 years     Family History  Problem Relation Age of Onset   Diabetes Mother    Mental illness Mother    Cancer Father      Social History   Socioeconomic History  Marital status: Divorced    Spouse name: Not on file   Number of children: Not on file   Years of education: Not on file   Highest education level: Not on file  Occupational History   Not on file  Tobacco Use   Smoking status: Never Smoker   Smokeless tobacco: Never Used  Substance and Sexual Activity   Alcohol use: Never   Drug use: Never   Sexual activity: Not on file  Other Topics Concern   Not on file  Social History Narrative   12/2017   Moved recently from [place] alone   DV  situation with niece   Has daughter and grandchildren in [place]   Retired   Psychologist, counselling daily   Social Determinants of Health   Financial Resource Strain:    Difficulty of Paying Living Expenses: Not on file  Food Insecurity:    Worried About Charity fundraiser in the Last Year: Not on file   [place] in the Last Year: Not on file  Transportation Needs:    Lack of Transportation (Medical): Not on file   Lack of Transportation (Non-Medical): Not on file  Physical Activity:    Days of Exercise per Week: Not on file   Minutes of Exercise per Session: Not on file  Stress:    Feeling of Stress : Not on file  Social Connections:    Frequency of Communication with Friends and Family: Not on file   Frequency of Social Gatherings with Friends and Family: Not on file   Attends Religious Services: Not on file   Active Member of Clubs or Organizations: Not on file   Attends Archivist Meetings: Not on file   Marital Status: Not on file  Intimate Partner Violence:    Fear of Current or Ex-Partner: Not on file   Emotionally Abused: Not on file   Physically Abused: Not on file   Sexually Abused: Not on file     Allergies  Allergen Reactions   Aspirin    Jardiance [Empagliflozin] Other (See Comments)    abd pain and dry mouth     Prior to Admission medications   Medication Sig Start Date End Date Taking? Authorizing Provider  Accu-Chek Softclix Lancets lancets CHECK BLOOD GLUCOSE ONE TIME DAILY 04/12/20  Yes Rutherford Guys, MD  aluminum-magnesium hydroxide-simethicone (MAALOX) 200-200-20 MG/5ML SUSP Take 30 mLs by mouth 4 (four) times daily -  before meals and at bedtime.   Yes [provider]  atorvastatin (LIPITOR) 20 MG tablet Take 1 tablet (20 mg total) by mouth daily. 11/24/19  Yes Rutherford Guys, MD  blood glucose meter kit and supplies Per insurance preference. Check blood glucose once a day. Dx E11.65 07/21/19  Yes Rutherford Guys, MD    citalopram (CELEXA) 40 MG tablet Take 1 tablet (40 mg total) by mouth daily. 04/01/20  Yes Rutherford Guys, MD  glipiZIDE (GLUCOTROL) 10 MG tablet Take 1 tablet (10 mg total) by mouth 2 (two) times daily before a meal. 04/18/20  Yes Rutherford Guys, MD  glucose blood test strip ACCU-CHEK. Use to test blood sugar once a day, alternate fasting with 2 hours after dinner.  Dx E11.9 07/06/20  Yes Rutherford Guys, MD  metFORMIN (GLUCOPHAGE) 1000 MG tablet TAKE 1 TABLET TWICE DAILY WITH MEALS 04/21/20  Yes Rutherford Guys, MD     Depression screen [facility] 2/9 08/24/2020 05/24/2020 04/01/2020 11/24/2019 08/25/2019  Decreased Interest  1 0 1 1 0  Down, Depressed, Hopeless 1 0 1 2 0  PHQ - 2 Score 2 0 2 3 0  Altered sleeping 1 - - 3 -  Tired, decreased energy 1 - - 1 -  Change in appetite 1 - - 2 -  Feeling bad or failure about yourself  1 - - 1 -  Trouble concentrating 1 - - 2 -  Moving slowly or fidgety/restless 0 - - 1 -  Suicidal thoughts 0 - - 0 -  PHQ-9 Score 7 - - 13 -  Difficult doing work/chores Somewhat difficult - - Not difficult at all -     Fall Risk  08/24/2020 05/24/2020 04/01/2020 11/24/2019 11/24/2019  Falls in the past year? 0 0 0 0 0  Number falls in past yr: 0 - 0 0 0  Injury with Fall? 0 - 0 0 0  Follow up Falls evaluation completed;Education provided - Falls evaluation completed - -      PHYSICAL EXAM: BP 136/70 Comment: taken from a previous visit   Ht 5' 9"  (1.753 m)    Wt 187 lb (84.8 kg)    BMI 27.62 kg/m    Wt Readings from Last 3 Encounters:  08/24/20 187 lb (84.8 kg)  04/01/20 187 lb 3.2 oz (84.9 kg)  11/24/19 184 lb 9.6 oz (83.7 kg)       Education/Counseling provided regarding diet and exercise, prevention of chronic diseases, smoking/tobacco cessation, if applicable, and reviewed "Covered Medicare Preventive Services."

## 2020-08-24 NOTE — Patient Instructions (Addendum)
Thank you for taking time to come for your Medicare Wellness Visit. I appreciate your ongoing commitment to your health goals. Please review the following plan we discussed and let me know if I can assist you in the future.  Leroy Kennedy LPN  Preventive Care 54-61 Years Old, Female Preventive care refers to visits with your health care provider and lifestyle choices that can promote health and wellness. This includes:  A yearly physical exam. This may also be called an annual well check.  Regular dental visits and eye exams.  Immunizations.  Screening for certain conditions.  Healthy lifestyle choices, such as eating a healthy diet, getting regular exercise, not using drugs or products that contain nicotine and tobacco, and limiting alcohol use. What can I expect for my preventive care visit? Physical exam Your health care provider will check your:  Height and weight. This may be used to calculate body mass index (BMI), which tells if you are at a healthy weight.  Heart rate and blood pressure.  Skin for abnormal spots. Counseling Your health care provider may ask you questions about your:  Alcohol, tobacco, and drug use.  Emotional well-being.  Home and relationship well-being.  Sexual activity.  Eating habits.  Work and work Statistician.  Method of birth control.  Menstrual cycle.  Pregnancy history. What immunizations do I need?  Influenza (flu) vaccine  This is recommended every year. Tetanus, diphtheria, and pertussis (Tdap) vaccine  You may need a Td booster every 10 years. Varicella (chickenpox) vaccine  You may need this if you have not been vaccinated. Zoster (shingles) vaccine  You may need this after age 25. Measles, mumps, and rubella (MMR) vaccine  You may need at least one dose of MMR if you were born in 1957 or later. You may also need a second dose. Pneumococcal conjugate (PCV13) vaccine  You may need this if you have certain conditions  and were not previously vaccinated. Pneumococcal polysaccharide (PPSV23) vaccine  You may need one or two doses if you smoke cigarettes or if you have certain conditions. Meningococcal conjugate (MenACWY) vaccine  You may need this if you have certain conditions. Hepatitis A vaccine  You may need this if you have certain conditions or if you travel or work in places where you may be exposed to hepatitis A. Hepatitis B vaccine  You may need this if you have certain conditions or if you travel or work in places where you may be exposed to hepatitis B. Haemophilus influenzae type b (Hib) vaccine  You may need this if you have certain conditions. Human papillomavirus (HPV) vaccine  If recommended by your health care provider, you may need three doses over 6 months. You may receive vaccines as individual doses or as more than one vaccine together in one shot (combination vaccines). Talk with your health care provider about the risks and benefits of combination vaccines. What tests do I need? Blood tests  Lipid and cholesterol levels. These may be checked every 5 years, or more frequently if you are over 61 years old.  Hepatitis C test.  Hepatitis B test. Screening  Lung cancer screening. You may have this screening every year starting at age 8 if you have a 30-pack-year history of smoking and currently smoke or have quit within the past 15 years.  Colorectal cancer screening. All adults should have this screening starting at age 103 and continuing until age 42. Your health care provider may recommend screening at age 57 if you are  at increased risk. You will have tests every 1-10 years, depending on your results and the type of screening test.  Diabetes screening. This is done by checking your blood sugar (glucose) after you have not eaten for a while (fasting). You may have this done every 1-3 years.  Mammogram. This may be done every 1-2 years. Talk with your health care provider  about when you should start having regular mammograms. This may depend on whether you have a family history of breast cancer.  BRCA-related cancer screening. This may be done if you have a family history of breast, ovarian, tubal, or peritoneal cancers.  Pelvic exam and Pap test. This may be done every 3 years starting at age 28. Starting at age 45, this may be done every 5 years if you have a Pap test in combination with an HPV test. Other tests  Sexually transmitted disease (STD) testing.  Bone density scan. This is done to screen for osteoporosis. You may have this scan if you are at high risk for osteoporosis. Follow these instructions at home: Eating and drinking  Eat a diet that includes fresh fruits and vegetables, whole grains, lean protein, and low-fat dairy.  Take vitamin and mineral supplements as recommended by your health care provider.  Do not drink alcohol if: ? Your health care provider tells you not to drink. ? You are pregnant, may be pregnant, or are planning to become pregnant.  If you drink alcohol: ? Limit how much you have to 0-1 drink a day. ? Be aware of how much alcohol is in your drink. In the [place] one drink equals one 12 oz bottle of beer (355 mL), one 5 oz glass of wine (148 mL), or one 1 oz glass of hard liquor (44 mL). Lifestyle  Take daily care of your teeth and gums.  Stay active. Exercise for at least 30 minutes on 5 or more days each week.  Do not use any products that contain nicotine or tobacco, such as cigarettes, e-cigarettes, and chewing tobacco. If you need help quitting, ask your health care provider.  If you are sexually active, practice safe sex. Use a condom or other form of birth control (contraception) in order to prevent pregnancy and STIs (sexually transmitted infections).  If told by your health care provider, take low-dose aspirin daily starting at age 67. What's next?  Visit your health care provider once a year for a well  check visit.  Ask your health care provider how often you should have your eyes and teeth checked.  Stay up to date on all vaccines. This information is not intended to replace advice given to you by your health care provider. Make sure you discuss any questions you have with your health care provider. Document Revised: 05/22/2018 Document Reviewed: 05/22/2018 Elsevier Patient Education  2020 [employer].

## 2020-08-30 ENCOUNTER — Other Ambulatory Visit: Payer: Self-pay

## 2020-08-30 ENCOUNTER — Ambulatory Visit: Payer: Medicare PPO | Admitting: Family Medicine

## 2020-08-30 ENCOUNTER — Encounter: Payer: Self-pay | Admitting: Family Medicine

## 2020-08-30 VITALS — BP 138/75 | HR 90 | Temp 96.3°F | Ht 69.0 in | Wt 185.0 lb

## 2020-08-30 DIAGNOSIS — H6123 Impacted cerumen, bilateral: Secondary | ICD-10-CM

## 2020-08-30 DIAGNOSIS — F3342 Major depressive disorder, recurrent, in full remission: Secondary | ICD-10-CM | POA: Diagnosis not present

## 2020-08-30 DIAGNOSIS — E2839 Other primary ovarian failure: Secondary | ICD-10-CM

## 2020-08-30 DIAGNOSIS — H9193 Unspecified hearing loss, bilateral: Secondary | ICD-10-CM

## 2020-08-30 DIAGNOSIS — Z23 Encounter for immunization: Secondary | ICD-10-CM | POA: Diagnosis not present

## 2020-08-30 DIAGNOSIS — E1169 Type 2 diabetes mellitus with other specified complication: Secondary | ICD-10-CM

## 2020-08-30 DIAGNOSIS — Z1231 Encounter for screening mammogram for malignant neoplasm of breast: Secondary | ICD-10-CM

## 2020-08-30 DIAGNOSIS — E1165 Type 2 diabetes mellitus with hyperglycemia: Secondary | ICD-10-CM | POA: Diagnosis not present

## 2020-08-30 DIAGNOSIS — M349 Systemic sclerosis, unspecified: Secondary | ICD-10-CM | POA: Diagnosis not present

## 2020-08-30 DIAGNOSIS — F330 Major depressive disorder, recurrent, mild: Secondary | ICD-10-CM

## 2020-08-30 DIAGNOSIS — I10 Essential (primary) hypertension: Secondary | ICD-10-CM

## 2020-08-30 DIAGNOSIS — E785 Hyperlipidemia, unspecified: Secondary | ICD-10-CM

## 2020-08-30 LAB — HEMOGLOBIN A1C
Est. average glucose Bld gHb Est-mCnc: 180 mg/dL
Hgb A1c MFr Bld: 7.9 % — ABNORMAL HIGH (ref 4.8–5.6)

## 2020-08-30 MED ORDER — DEBROX 6.5 % OT SOLN: 10.0000 [drp] | Freq: Two times a day (BID) | OTIC | 0 refills | Status: AC

## 2020-08-30 MED ORDER — BLOOD GLUCOSE METER KIT: PACK | 3 refills | Status: DC

## 2020-08-30 MED ORDER — METFORMIN HCL 1000 MG PO TABS: ORAL_TABLET | ORAL | 3 refills | Status: DC

## 2020-08-30 MED ORDER — CITALOPRAM HYDROBROMIDE 40 MG PO TABS: 40.0000 mg | ORAL_TABLET | Freq: Every day | ORAL | 3 refills | Status: DC

## 2020-08-30 NOTE — Patient Instructions (Addendum)
Needs to get Tetanus shot at local pharmacy  Needs to schedule: DEXA, mammogram  Referral placed for opthamology   If you have lab work done today you will be contacted with your lab results within the next 2 weeks.  If you have not heard from [place] then please contact us. The fastest way to get your results is to register for My Chart.   IF you received an x-ray today, you will receive an invoice from [facility]. Please contact [facility] at [phone] with questions or concerns regarding your invoice.   IF you received labwork today, you will receive an invoice from [facility]. Please contact [facility] at [phone] with questions or concerns regarding your invoice.   Our billing staff will not be able to assist you with questions regarding bills from these companies.  You will be contacted with the lab results as soon as they are available. The fastest way to get your results is to activate your My Chart account. Instructions are located on the last page of this paperwork. If you have not heard from [place] regarding the results in 2 weeks, please contact this office.      Health Maintenance for Postmenopausal Women Menopause is a normal process in which your ability to get pregnant comes to an end. This process happens slowly over many months or years, usually between the ages of 45 and 86. Menopause is complete when you have missed your menstrual periods for 12 months. It is important to talk with your health care provider about some of the most common conditions that affect women after menopause (postmenopausal women). These include heart disease, cancer, and bone loss (osteoporosis). Adopting a healthy lifestyle and getting preventive care can help to promote your health and wellness. The actions you take can also lower your chances of developing some of these common conditions. What should I know about menopause? During menopause, you may get a number of symptoms, such  as:  Hot flashes. These can be moderate or severe.  Night sweats.  Decrease in sex drive.  Mood swings.  Headaches.  Tiredness.  Irritability.  Memory problems.  Insomnia. Choosing to treat or not to treat these symptoms is a decision that you make with your health care provider. Do I need hormone replacement therapy?  Hormone replacement therapy is effective in treating symptoms that are caused by menopause, such as hot flashes and night sweats.  Hormone replacement carries certain risks, especially as you become older. If you are thinking about using estrogen or estrogen with progestin, discuss the benefits and risks with your health care provider. What is my risk for heart disease and stroke? The risk of heart disease, heart attack, and stroke increases as you age. One of the causes may be a change in the body's hormones during menopause. This can affect how your body uses dietary fats, triglycerides, and cholesterol. Heart attack and stroke are medical emergencies. There are many things that you can do to help prevent heart disease and stroke. Watch your blood pressure  High blood pressure causes heart disease and increases the risk of stroke. This is more likely to develop in people who have high blood pressure readings, are of African descent, or are overweight.  Have your blood pressure checked: ? Every 3-5 years if you are 40-20 years of age. ? Every year if you are 39 years old or older. Eat a healthy diet   Eat a diet that includes plenty of vegetables, fruits, low-fat dairy products, and  lean protein.  Do not eat a lot of foods that are high in solid fats, added sugars, or sodium. Get regular exercise Get regular exercise. This is one of the most important things you can do for your health. Most adults should:  Try to exercise for at least 150 minutes each week. The exercise should increase your heart rate and make you sweat (moderate-intensity exercise).  Try  to do strengthening exercises at least twice each week. Do these in addition to the moderate-intensity exercise.  Spend less time sitting. Even light physical activity can be beneficial. Other tips  Work with your health care provider to achieve or maintain a healthy weight.  Do not use any products that contain nicotine or tobacco, such as cigarettes, e-cigarettes, and chewing tobacco. If you need help quitting, ask your health care provider.  Know your numbers. Ask your health care provider to check your cholesterol and your blood sugar (glucose). Continue to have your blood tested as directed by your health care provider. Do I need screening for cancer? Depending on your health history and family history, you may need to have cancer screening at different stages of your life. This may include screening for:  Breast cancer.  Cervical cancer.  Lung cancer.  Colorectal cancer. What is my risk for osteoporosis? After menopause, you may be at increased risk for osteoporosis. Osteoporosis is a condition in which bone destruction happens more quickly than new bone creation. To help prevent osteoporosis or the bone fractures that can happen because of osteoporosis, you may take the following actions:  If you are 4-59 years old, get at least 1,000 mg of calcium and at least 600 mg of vitamin D per day.  If you are older than age 65 but younger than age 45, get at least 1,200 mg of calcium and at least 600 mg of vitamin D per day.  If you are older than age 60, get at least 1,200 mg of calcium and at least 800 mg of vitamin D per day. Smoking and drinking excessive alcohol increase the risk of osteoporosis. Eat foods that are rich in calcium and vitamin D, and do weight-bearing exercises several times each week as directed by your health care provider. How does menopause affect my mental health? Depression may occur at any age, but it is more common as you become older. Common symptoms of  depression include:  Low or sad mood.  Changes in sleep patterns.  Changes in appetite or eating patterns.  Feeling an overall lack of motivation or enjoyment of activities that you previously enjoyed.  Frequent crying spells. Talk with your health care provider if you think that you are experiencing depression. General instructions See your health care provider for regular wellness exams and vaccines. This may include:  Scheduling regular health, dental, and eye exams.  Getting and maintaining your vaccines. These include: ? Influenza vaccine. Get this vaccine each year before the flu season begins. ? Pneumonia vaccine. ? Shingles vaccine. ? Tetanus, diphtheria, and pertussis (Tdap) booster vaccine. Your health care provider may also recommend other immunizations. Tell your health care provider if you have ever been abused or do not feel safe at home. Summary  Menopause is a normal process in which your ability to get pregnant comes to an end.  This condition causes hot flashes, night sweats, decreased interest in sex, mood swings, headaches, or lack of sleep.  Treatment for this condition may include hormone replacement therapy.  Take actions to keep yourself  healthy, including exercising regularly, eating a healthy diet, watching your weight, and checking your blood pressure and blood sugar levels.  Get screened for cancer and depression. Make sure that you are up to date with all your vaccines. This information is not intended to replace advice given to you by your health care provider. Make sure you discuss any questions you have with your health care provider. Document Revised: 09/03/2018 Document Reviewed: 09/03/2018 Elsevier Patient Education  2020 [employer].

## 2020-08-30 NOTE — Progress Notes (Signed)
12/7/202112:53 PM  Jennifer Rhodes [birth year], 59 y.o., female [id number]  Chief Complaint  Patient presents with  . Transitions Of Care  . Diabetes  . R ear hearing diminished  . shoulder and neck tension/ pain    HPI:    Patient is a 59 y.o. female with past medical history significant for DM, HLD, Depression and scleroderma who presents today for transfer of care.  Type 2 diabetes mellitus with hyperglycemia, without long-term current use of insulin (HCC) Needs a new meter Has been unable to check BG at home discussed importance of LFM  Lab Results  Component Value Date   HGBA1C 8.3 (H) 04/01/2020   attends diabetic education This has been going well glipiZIDE (GLUCOTROL) 10 MG tablet; 1 tab bid metFORMIN (GLUCOPHAGE) 1000 MG tablet; 1 tab bid Atorvastatin 28m daily  Mild episode of recurrent major depressive disorder (HCC)  Increase celexa to 430mdaily last visit . Discussed behavior activation, declines counseling at this time PhQ 9 =0 - citalopram (CELEXA) 40 MG tablet; Take 1 tablet (40 mg total) by mouth daily.  Screening Mammogram: due Colon cancer: colonoscopy 2017 repeat 5 years (2022 due) Pap: Due age [age] patient LMP: not since 305s[facility] 08/30/2020 08/24/2020 08/24/2020  Decreased Interest 0 1 1  Down, Depressed, Hopeless 0 1 1  PHQ - 2 Score 0 2 2  Altered sleeping 0 1 1  Tired, decreased energy 0 1 1  Change in appetite 0 1 1  Feeling bad or failure about yourself  0 1 1  Trouble concentrating 0 1 1  Moving slowly or fidgety/restless 0 0 0  Suicidal thoughts 0 0 0  PHQ-9 Score 0 7 7  Difficult doing work/chores Not difficult at all Somewhat difficult Somewhat difficult    Fall Risk  08/30/2020 08/24/2020 05/24/2020 04/01/2020 11/24/2019  Falls in the past year? 0 0 0 0 0  Number falls in past yr: 0 0 - 0 0  Injury with Fall? 0 0 - 0 0  Follow up Falls evaluation completed Falls evaluation completed;Education provided - Falls  evaluation completed -     Allergies  Allergen Reactions  . Aspirin   . Jardiance [Empagliflozin] Other (See Comments)    abd pain and dry mouth    Prior to Admission medications   Medication Sig Start Date End Date Taking? Authorizing Provider  atorvastatin (LIPITOR) 20 MG tablet Take 1 tablet (20 mg total) by mouth daily. 11/24/19  Yes SaRutherford GuysMD  blood glucose meter kit and supplies Per insurance preference. Check blood glucose once a day. Dx E11.65 07/21/19  Yes SaRutherford GuysMD  citalopram (CELEXA) 40 MG tablet Take 1 tablet (40 mg total) by mouth daily. 04/01/20  Yes SaRutherford GuysMD  glipiZIDE (GLUCOTROL) 10 MG tablet Take 1 tablet (10 mg total) by mouth 2 (two) times daily before a meal. 04/18/20  Yes SaRutherford GuysMD  glucose blood test strip ACCU-CHEK. Use to test blood sugar once a day, alternate fasting with 2 hours after dinner.  Dx E11.9 07/06/20  Yes SaRutherford GuysMD  metFORMIN (GLUCOPHAGE) 1000 MG tablet TAKE 1 TABLET TWICE DAILY WITH MEALS 04/21/20  Yes SaRutherford GuysMD  aluminum-magnesium hydroxide-simethicone (MAALOX) 200-200-20 MG/5ML SUSP Take 30 mLs by mouth 4 (four) times daily -  before meals and at bedtime. Patient not taking: Reported on 08/30/2020    [provider]    Past Medical History:  Diagnosis Date  . Anxiety   . Cataract   . Depression   . Diabetes mellitus without complication ([place])   . History of colonic polyps   . Scleroderma [facility])     Past Surgical History:  Procedure Laterality Date  . CHOLECYSTECTOMY    . colonoscopy  2017   + polyps, repeat in 5 years    Social History   Tobacco Use  . Smoking status: Never Smoker  . Smokeless tobacco: Never Used  Substance Use Topics  . Alcohol use: Never    Family History  Problem Relation Age of Onset  . Diabetes Mother   . Mental illness Mother   . Cancer Father     Review of Systems  Constitutional: Negative for chills, fever and malaise/fatigue.   HENT: Positive for hearing loss. Negative for ear pain.   Eyes: Positive for blurred vision (cataracts). Negative for double vision and pain.  Respiratory: Negative for cough, shortness of breath and wheezing.   Cardiovascular: Negative for chest pain, palpitations and leg swelling.  Gastrointestinal: Negative for abdominal pain, blood in stool, constipation, diarrhea, heartburn, nausea and vomiting.  Genitourinary: Negative for dysuria, frequency and hematuria.  Musculoskeletal: Negative for back pain and joint pain.  Skin: Negative for rash.  Neurological: Negative for dizziness, weakness and headaches.  Psychiatric/Behavioral: Positive for depression. Negative for suicidal ideas.     OBJECTIVE:  Today's Vitals   08/30/20 1105  BP: 138/75  Pulse: 90  Temp: (!) 96.3 F (35.7 C)  SpO2: 99%  Weight: 185 lb (83.9 kg)  Height: 5' 9" (1.753 m)   Body mass index is 27.32 kg/m.   Physical Exam Constitutional:      General: She is not in acute distress.    Appearance: Normal appearance. She is not ill-appearing.  HENT:     Head: Normocephalic.     Right Ear: Ear canal and external ear normal. There is impacted cerumen.     Left Ear: Ear canal and external ear normal. There is impacted cerumen.  Eyes:     Extraocular Movements: Extraocular movements intact.     Pupils: Pupils are equal, round, and reactive to light.  Cardiovascular:     Rate and Rhythm: Normal rate and regular rhythm.     Pulses: Normal pulses.     Heart sounds: Normal heart sounds. No murmur heard.  No friction rub. No gallop.   Pulmonary:     Effort: Pulmonary effort is normal. No respiratory distress.     Breath sounds: Normal breath sounds. No stridor. No wheezing, rhonchi or rales.  Abdominal:     General: Bowel sounds are normal.     Palpations: Abdomen is soft.     Tenderness: There is no abdominal tenderness.  Musculoskeletal:     Right lower leg: No edema.     Left lower leg: No edema.   Skin:    General: Skin is warm and dry.  Neurological:     Mental Status: She is alert and oriented to person, place, and time.  Psychiatric:        Mood and Affect: Mood normal.        Behavior: Behavior normal.     No results found for this or any previous visit (from the past 24 hour(s)).  No results found.   ASSESSMENT and PLAN  Problem List Items Addressed This Visit      Cardiovascular and Mediastinum   Essential hypertension, benign BP Readings from Last 3 Encounters:  08/30/20  138/75  08/24/20 136/70  04/01/20 136/70   At goal < 140/90     Endocrine   Type 2 diabetes mellitus with hyperglycemia, without long-term current use of insulin (HCC) - Primary   Relevant Medications   blood glucose meter kit and supplies   metFORMIN (GLUCOPHAGE) 1000 MG tablet   Other Relevant Orders   HM Diabetes Foot Exam (Completed)   Hemoglobin A1c   Ambulatory referral to Ophthalmology   Microalbumin/Creatinine Ratio, Urine Will follow up with lab results   Hyperlipidemia associated with type 2 diabetes mellitus ([place])   Relevant Medications   metFORMIN (GLUCOPHAGE) 1000 MG tablet Lab Results  Component Value Date   CHOL 112 11/24/2019   HDL 54 11/24/2019   LDLCALC 47 11/24/2019   TRIG 40 11/24/2019   CHOLHDL 2.1 11/24/2019  At goal LDL< 100     Other   Recurrent major depressive disorder, in full remission ([place])   Relevant Medications   citalopram (CELEXA) 40 MG tablet   Scleroderma ([place])    Other Visit Diagnoses    Encounter for screening mammogram for malignant neoplasm of breast       Relevant Orders   MS DIGITAL SCREENING TOMO BILATERAL   Progressive hearing loss of both ears       Relevant Orders   Ear Lavage   Estrogen deficiency       Relevant Orders   DG Bone Density   Mild episode of recurrent major depressive disorder (HCC)       Relevant Medications   citalopram (CELEXA) 40 MG tablet: daily Stable on current regimen   Need for immunization  against influenza       Relevant Orders   Flu Vaccine QUAD 36+ mos IM (Completed)   Bilateral impacted cerumen       Relevant Medications   carbamide peroxide (DEBROX) 6.5 % OTIC solution 10 drops bid x 4 days Unable to remove with irrigation RTC precautions provided      Return in about 3 months (around 11/28/2020).   Huston Foley Just, FNP-BC Primary Care at [place], [place] [address] Ph.  [phone] Fax [phone]

## 2020-08-31 ENCOUNTER — Other Ambulatory Visit: Payer: Self-pay | Admitting: Family Medicine

## 2020-08-31 DIAGNOSIS — E1165 Type 2 diabetes mellitus with hyperglycemia: Secondary | ICD-10-CM

## 2020-08-31 LAB — MICROALBUMIN / CREATININE URINE RATIO
Creatinine, Urine: 112.3 mg/dL
Microalb/Creat Ratio: 8 mg/g creat (ref 0–29)
Microalbumin, Urine: 8.8 ug/mL

## 2020-08-31 MED ORDER — TRULICITY 0.75 MG/0.5ML ~~LOC~~ SOAJ: 0.7500 mg | SUBCUTANEOUS | 4 refills | Status: DC

## 2020-08-31 NOTE — Progress Notes (Signed)
If you could let Jennifer Rhodes know her A1C remains quite elevated. I am going to start her on a weekly medication called trulicity. I sent this to her pharmacy.

## 2020-09-02 ENCOUNTER — Other Ambulatory Visit: Payer: Self-pay

## 2020-09-02 DIAGNOSIS — E1165 Type 2 diabetes mellitus with hyperglycemia: Secondary | ICD-10-CM

## 2020-09-02 MED ORDER — BLOOD GLUCOSE METER KIT: PACK | 3 refills | Status: DC

## 2020-09-14 NOTE — Telephone Encounter (Signed)
Schedule appointment follow up Diabetes

## 2020-09-20 ENCOUNTER — Encounter: Payer: Self-pay | Admitting: Family Medicine

## 2020-09-20 ENCOUNTER — Other Ambulatory Visit: Payer: Self-pay | Admitting: Family Medicine

## 2020-09-20 ENCOUNTER — Telehealth (INDEPENDENT_AMBULATORY_CARE_PROVIDER_SITE_OTHER): Payer: Medicare PPO | Admitting: Family Medicine

## 2020-09-20 ENCOUNTER — Other Ambulatory Visit: Payer: Self-pay

## 2020-09-20 DIAGNOSIS — R059 Cough, unspecified: Secondary | ICD-10-CM

## 2020-09-20 MED ORDER — BENZONATATE 100 MG PO CAPS: 100.0000 mg | ORAL_CAPSULE | Freq: Three times a day (TID) | ORAL | 0 refills | Status: DC | PRN

## 2020-09-20 NOTE — Progress Notes (Signed)
Virtual Visit Note  I connected with patient on 09/20/20 at 1606 by telephone due to unable to work Epic video visit and verified that I am speaking with the correct person using two identifiers. Jennifer Rhodes is currently located at home and no family members are currently with them during visit. The provider, Laurita Quint Felica Chargois, FNP is located in their office at time of visit.  I discussed the limitations, risks, security and privacy concerns of performing an evaluation and management service by telephone and the availability of in person appointments. I also discussed with the patient that there may be a patient responsible charge related to this service. The patient expressed understanding and agreed to proceed.   I provided 20 minutes of non-face-to-face time during this encounter.  Chief Complaint  Patient presents with  . Cough    Couple of days , using cough drops Headaches on and off    HPI ? Cough is the worse at night Denies Hx of asthma, GERD, and allergies Takes cough drops and this helps a lot    Allergies  Allergen Reactions  . Aspirin   . Jardiance [Empagliflozin] Other (See Comments)    abd pain and dry mouth    Prior to Admission medications   Medication Sig Start Date End Date Taking? Authorizing Provider  atorvastatin (LIPITOR) 20 MG tablet Take 1 tablet (20 mg total) by mouth daily. 11/24/19  Yes Jacelyn Pi, Lilia Argue, MD  citalopram (CELEXA) 40 MG tablet Take 1 tablet (40 mg total) by mouth daily. 08/30/20  Yes Eligio Angert, Laurita Quint, FNP  glipiZIDE (GLUCOTROL) 10 MG tablet Take 1 tablet (10 mg total) by mouth 2 (two) times daily before a meal. 04/18/20  Yes Jacelyn Pi, Lilia Argue, MD  metFORMIN (GLUCOPHAGE) 1000 MG tablet TAKE 1 TABLET TWICE DAILY WITH MEALS 08/30/20  Yes Sherrell Farish, Laurita Quint, FNP  blood glucose meter kit and supplies Per insurance preference. Check blood glucose once a day. Dx E11.65 Patient not taking: Reported on 09/20/2020 07/21/19   Jacelyn Pi, Lilia Argue, MD   blood glucose meter kit and supplies Dispense based on patient and insurance preference. Use up to four times daily as directed. (FOR ICD-10 E10.9, E11.9). Patient not taking: Reported on 09/20/2020 09/02/20   Fallan Mccarey, Laurita Quint, FNP  Dulaglutide (TRULICITY) 2.40 XB/3.5HG SOPN Inject 0.75 mg into the skin once a week. Patient not taking: Reported on 09/20/2020 08/31/20   Jurney Overacker, Laurita Quint, FNP  glucose blood test strip ACCU-CHEK. Use to test blood sugar once a day, alternate fasting with 2 hours after dinner.  Dx E11.9 Patient not taking: Reported on 09/20/2020 07/06/20   Jacelyn Pi, Lilia Argue, MD    Past Medical History:  Diagnosis Date  . Anxiety   . Cataract   . Depression   . Diabetes mellitus without complication ([place])   . History of colonic polyps   . Scleroderma [facility])     Past Surgical History:  Procedure Laterality Date  . CHOLECYSTECTOMY    . colonoscopy  2017   + polyps, repeat in 5 years    Social History   Tobacco Use  . Smoking status: Never Smoker  . Smokeless tobacco: Never Used  Substance Use Topics  . Alcohol use: Never    Family History  Problem Relation Age of Onset  . Diabetes Mother   . Mental illness Mother   . Cancer Father     Review of Systems  Constitutional: Negative for chills, fever and malaise/fatigue.  HENT:  Negative for congestion, sinus pain and sore throat.   Eyes: Negative for discharge and redness.  Respiratory: Positive for cough. Negative for sputum production, shortness of breath and wheezing.   Cardiovascular: Negative for chest pain and palpitations.  Gastrointestinal: Negative for constipation, diarrhea, heartburn, nausea and vomiting.  Musculoskeletal: Negative for myalgias.  Neurological: Positive for headaches.  Psychiatric/Behavioral: The patient has insomnia.     Objective  Constitutional:      General: She is not in acute distress.    Appearance: Normal appearance. She is not ill-appearing.   Pulmonary:     Effort:  Pulmonary effort is normal. No respiratory distress.  Neurological:     Mental Status: She is alert and oriented to person, place, and time.  Psychiatric:        Mood and Affect: Mood normal.        Behavior: Behavior normal.     ASSESSMENT and PLAN  Problem List Items Addressed This Visit   None   Visit Diagnoses    Cough    -  Primary   Relevant Medications   benzonatate (TESSALON) 100 MG capsule   Other Relevant Orders   COVID-19, Flu A+B and RSV   Discussed the use of conservative treatments This is most likely a viral illness that will run its course RTC precautions provided R/se/b of medications discussed Continue to use OTC treatments such as cough drops as needed       Return in about 4 weeks (around 10/18/2020), or if symptoms worsen or fail to improve.    The above assessment and management plan was discussed with the patient. The patient verbalized understanding of and has agreed to the management plan. Patient is aware to call the clinic if symptoms persist or worsen. Patient is aware when to return to the clinic for a follow-up visit. Patient educated on when it is appropriate to go to the emergency department.     Huston Foley Kalise Fickett, FNP-BC Primary Care at [place], [place] [address] Ph.  [phone] Fax [phone]

## 2020-09-20 NOTE — Patient Instructions (Addendum)
   If you have lab work done today you will be contacted with your lab results within the next 2 weeks.  If you have not heard from us then please contact us. The fastest way to get your results is to register for My Chart.   IF you received an x-ray today, you will receive an invoice from [facility]. Please contact [facility] at [phone] with questions or concerns regarding your invoice.   IF you received labwork today, you will receive an invoice from [facility]. Please contact [facility] at [phone] with questions or concerns regarding your invoice.   Our billing staff will not be able to assist you with questions regarding bills from these companies.  You will be contacted with the lab results as soon as they are available. The fastest way to get your results is to activate your My Chart account. Instructions are located on the last page of this paperwork. If you have not heard from us regarding the results in 2 weeks, please contact this office.      Cough, Adult A cough helps to clear your throat and lungs. A cough may be a sign of an illness or another medical condition. An acute cough may only last 2-3 weeks, while a chronic cough may last 8 or more weeks. Many things can cause a cough. They include:  Germs (viruses or bacteria) that attack the airway.  Breathing in things that bother (irritate) your lungs.  Allergies.  Asthma.  Mucus that runs down the back of your throat (postnasal drip).  Smoking.  Acid backing up from the stomach into the tube that moves food from the mouth to the stomach (gastroesophageal reflux).  Some medicines.  Lung problems.  Other medical conditions, such as heart failure or a blood clot in the lung (pulmonary embolism). Follow these instructions at home: Medicines  Take over-the-counter and prescription medicines only as told by your doctor.  Talk with your doctor before you take medicines that stop a cough  (coughsuppressants). Lifestyle   Do not smoke, and try not to be around smoke. Do not use any products that contain nicotine or tobacco, such as cigarettes, e-cigarettes, and chewing tobacco. If you need help quitting, ask your doctor.  Drink enough fluid to keep your pee (urine) pale yellow.  Avoid caffeine.  Do not drink alcohol if your doctor tells you not to drink. General instructions   Watch for any changes in your cough. Tell your doctor about them.  Always cover your mouth when you cough.  Stay away from things that make you cough, such as perfume, candles, campfire smoke, or cleaning products.  If the air is dry, use a cool mist vaporizer or humidifier in your home.  If your cough is worse at night, try using extra pillows to raise your head up higher while you sleep.  Rest as needed.  Keep all follow-up visits as told by your doctor. This is important. Contact a doctor if:  You have new symptoms.  You cough up pus.  Your cough does not get better after 2-3 weeks, or your cough gets worse.  Cough medicine does not help your cough and you are not sleeping well.  You have pain that gets worse or pain that is not helped with medicine.  You have a fever.  You are losing weight and you do not know why.  You have night sweats. Get help right away if:  You cough up blood.  You have trouble   breathing.  Your heartbeat is very fast. These symptoms may be an emergency. Do not wait to see if the symptoms will go away. Get medical help right away. Call your local emergency services (911 in the [place] Do not drive yourself to the hospital. Summary  A cough helps to clear your throat and lungs. Many things can cause a cough.  Take over-the-counter and prescription medicines only as told by your doctor.  Always cover your mouth when you cough.  Contact a doctor if you have new symptoms or you have a cough that does not get better or gets worse. This information  is not intended to replace advice given to you by your health care provider. Make sure you discuss any questions you have with your health care provider. Document Revised: 09/29/2018 Document Reviewed: 09/29/2018 Elsevier Patient Education  2020 [employer] Inc[employer]  

## 2020-09-21 ENCOUNTER — Other Ambulatory Visit: Payer: Self-pay

## 2020-09-21 ENCOUNTER — Ambulatory Visit (INDEPENDENT_AMBULATORY_CARE_PROVIDER_SITE_OTHER): Payer: Medicare PPO | Admitting: Family Medicine

## 2020-09-21 DIAGNOSIS — R059 Cough, unspecified: Secondary | ICD-10-CM

## 2020-09-22 LAB — COVID-19, FLU A+B AND RSV
Influenza A, NAA: NOT DETECTED
Influenza B, NAA: NOT DETECTED
RSV, NAA: NOT DETECTED
SARS-CoV-2, NAA: DETECTED — AB

## 2020-10-13 ENCOUNTER — Ambulatory Visit: Payer: Medicare PPO

## 2020-10-17 ENCOUNTER — Telehealth: Payer: Self-pay | Admitting: Family Medicine

## 2020-10-17 ENCOUNTER — Other Ambulatory Visit: Payer: Self-pay

## 2020-10-17 DIAGNOSIS — E1165 Type 2 diabetes mellitus with hyperglycemia: Secondary | ICD-10-CM

## 2020-10-17 MED ORDER — BLOOD GLUCOSE METER KIT: PACK | 3 refills | Status: DC

## 2020-10-17 MED ORDER — GLIPIZIDE 10 MG PO TABS: 10.0000 mg | ORAL_TABLET | Freq: Two times a day (BID) | ORAL | 1 refills | Status: DC

## 2020-10-17 NOTE — Telephone Encounter (Signed)
I am ok with a printed copy. Thanks!

## 2020-10-17 NOTE — Telephone Encounter (Signed)
Faxed to [facility]

## 2020-10-17 NOTE — Telephone Encounter (Signed)
Sent glipizide but it wont allow me to electronically send Glucose supplies please send or you can okay for printed copy to sign and fax.

## 2020-10-17 NOTE — Telephone Encounter (Signed)
Pt called and is needing a Rx for the   Meter and glipiZIDE (GLUCOTROL) 10 MG tablet [[id number]]  were not sent in to the [facility].  Please advise.

## 2020-10-27 ENCOUNTER — Other Ambulatory Visit: Payer: Self-pay | Admitting: Family Medicine

## 2020-10-27 DIAGNOSIS — E1165 Type 2 diabetes mellitus with hyperglycemia: Secondary | ICD-10-CM

## 2020-10-27 MED ORDER — BLOOD GLUCOSE METER KIT: PACK | 3 refills | Status: DC

## 2020-10-27 NOTE — Telephone Encounter (Signed)
Copied from [place] [id number]. Topic: Quick Communication - Rx Refill/Question >> Oct 27, 2020  9:36 AM Tessa Lerner A wrote: Medication: blood glucose meter kit and supplies [[id number]]   Has the patient contacted their pharmacy? No. Patient is having difficulty with previous pharmacy  Preferred Pharmacy (with phone number or street name): [place], [address]  Phone:  [phone]  Agent: Please be advised that RX refills may take up to 3 business days. We ask that you follow-up with your pharmacy.

## 2020-11-28 ENCOUNTER — Encounter: Payer: Self-pay | Admitting: Family Medicine

## 2020-11-28 ENCOUNTER — Other Ambulatory Visit: Payer: Self-pay

## 2020-11-28 ENCOUNTER — Ambulatory Visit: Payer: Medicare Other | Admitting: Family Medicine

## 2020-11-28 VITALS — BP 128/76 | HR 92 | Temp 97.2°F | Ht 69.0 in | Wt 183.0 lb

## 2020-11-28 DIAGNOSIS — E1165 Type 2 diabetes mellitus with hyperglycemia: Secondary | ICD-10-CM

## 2020-11-28 DIAGNOSIS — Z1231 Encounter for screening mammogram for malignant neoplasm of breast: Secondary | ICD-10-CM

## 2020-11-28 DIAGNOSIS — E1169 Type 2 diabetes mellitus with other specified complication: Secondary | ICD-10-CM | POA: Diagnosis not present

## 2020-11-28 DIAGNOSIS — I10 Essential (primary) hypertension: Secondary | ICD-10-CM | POA: Diagnosis not present

## 2020-11-28 DIAGNOSIS — E785 Hyperlipidemia, unspecified: Secondary | ICD-10-CM

## 2020-11-28 MED ORDER — SITAGLIPTIN PHOSPHATE 25 MG PO TABS: 25.0000 mg | ORAL_TABLET | Freq: Every day | ORAL | 3 refills | Status: DC

## 2020-11-28 NOTE — Progress Notes (Signed)
3/7/202211:35 AM  Jennifer Rhodes 20-Oct-1960, 60 y.o., female [id number]  Chief Complaint  Patient presents with  . Diabetes    Trulicity caused chest pains and dry mouth - stopped taking     HPI:    Patient is a 60 y.o. female with past medical history significant for DM, HLD, Depression and scleroderma who presents today for chronic care follow up.  Type 2 diabetes mellitus with hyperglycemia, without long-term current use of insulin (HCC) Got a new meter since last OV BG at home before meals average 290 discussed importance of LFM glipiZIDE (GLUCOTROL) 10 MG tablet; 1 tab bid metFORMIN (GLUCOPHAGE) 1000 MG tablet; 1 tab bid Atorvastatin 55m daily Started Trulicity last Ov but doesn't like this medication due to dry mouth and chest pain Would like to try something new Lab Results  Component Value Date   HGBA1C 7.9 (H) 08/30/2020     Mild episode of recurrent major depressive disorder ([place] declines counseling at this time Moods have been ok PhQ 2 =0  Screening Mammogram: due: needs to schedule Colon cancer: colonoscopy 2017 repeat 5 years (2022 due) Pap: Due age [age]er patient LMP: not since 3082sHealth Maintenance  Topic Date Due  . TETANUS/TDAP  Never done  . MAMMOGRAM  02/23/2019  . OPHTHALMOLOGY EXAM  07/05/2020  . COVID-19 Vaccine (3 - Booster for Pfizer series) 07/12/2020  . COLONOSCOPY (Pts 45-4944yrnsurance coverage will need to be confirmed)  09/24/2020  . HEMOGLOBIN A1C  02/28/2021  . FOOT EXAM  08/30/2021  . URINE MICROALBUMIN  08/30/2021  . PAP SMEAR-Modifier  02/22/2022  . INFLUENZA VACCINE  Completed  . PNEUMOCOCCAL POLYSACCHARIDE VACCINE AGE 68-64 HIGH RISK  Completed  . HPV VACCINES  Aged Out  . Hepatitis C Screening  Discontinued  . HIV Screening  Discontinued     Dep[facility] 11/28/2020 11/28/2020 08/30/2020  Decreased Interest 0 0 0  Down, Depressed, Hopeless 0 0 0  PHQ - 2 Score 0 0 0  Altered sleeping  0 - 0  Tired, decreased energy 0 - 0  Change in appetite 0 - 0  Feeling bad or failure about yourself  0 - 0  Trouble concentrating 0 - 0  Moving slowly or fidgety/restless 0 - 0  Suicidal thoughts 0 - 0  PHQ-9 Score 0 - 0  Difficult doing work/chores Not difficult at all - Not difficult at all    Fall Risk  11/28/2020 09/20/2020 08/30/2020 08/24/2020 05/24/2020  Falls in the past year? 0 0 0 0 0  Number falls in past yr: 0 0 0 0 -  Injury with Fall? 0 0 0 0 -  Follow up Falls evaluation completed Falls evaluation completed Falls evaluation completed Falls evaluation completed;Education provided -     Allergies  Allergen Reactions  . Aspirin   . Jardiance [Empagliflozin] Other (See Comments)    abd pain and dry mouth    Prior to Admission medications   Medication Sig Start Date End Date Taking? Authorizing Provider  atorvastatin (LIPITOR) 20 MG tablet Take 1 tablet (20 mg total) by mouth daily. 11/24/19  Yes [place] [facility]  blood glucose meter kit and supplies Per insurance preference. Check blood glucose once a day. Dx E11.65 07/21/19  Yes [place] [facility]  citalopram (CELEXA) 40 MG tablet Take 1 tablet (40 mg total) by mouth daily. 04/01/20  Yes [place] [facility]  glipiZIDE (GLUCOTROL) 10 MG tablet Take 1  tablet (10 mg total) by mouth 2 (two) times daily before a meal. 04/18/20  Yes [place] Guys, MD  glucose blood test strip ACCU-CHEK. Use to test blood sugar once a day, alternate fasting with 2 hours after dinner.  Dx E11.9 07/06/20  Yes Rutherford Guys, MD  metFORMIN (GLUCOPHAGE) 1000 MG tablet TAKE 1 TABLET TWICE DAILY WITH MEALS 04/21/20  Yes Rutherford Guys, MD  aluminum-magnesium hydroxide-simethicone (MAALOX) 200-200-20 MG/5ML SUSP Take 30 mLs by mouth 4 (four) times daily -  before meals and at bedtime. Patient not taking: Reported on 08/30/2020    [provider]    Past Medical History:  Diagnosis Date  . Anxiety   . Cataract   . Depression   .  Diabetes mellitus without complication ([place])   . History of colonic polyps   . Scleroderma [facility])     Past Surgical History:  Procedure Laterality Date  . CHOLECYSTECTOMY    . colonoscopy  2017   + polyps, repeat in 5 years    Social History   Tobacco Use  . Smoking status: Never Smoker  . Smokeless tobacco: Never Used  Substance Use Topics  . Alcohol use: Never    Family History  Problem Relation Age of Onset  . Diabetes Mother   . Mental illness Mother   . Cancer Father     Review of Systems  Constitutional: Negative for chills, fever and malaise/fatigue.  HENT: Negative for ear pain and hearing loss.   Eyes: Positive for blurred vision (cataracts). Negative for double vision and pain.  Respiratory: Negative for cough, shortness of breath and wheezing.   Cardiovascular: Negative for chest pain, palpitations and leg swelling.  Gastrointestinal: Negative for abdominal pain, blood in stool, constipation, diarrhea, heartburn, nausea and vomiting.  Genitourinary: Negative for dysuria, frequency and hematuria.  Musculoskeletal: Negative for back pain and joint pain.  Skin: Negative for rash.  Neurological: Negative for dizziness, weakness and headaches.  Psychiatric/Behavioral: Positive for depression. Negative for suicidal ideas.     OBJECTIVE:  Today's Vitals   11/28/20 1109  BP: 128/76  Pulse: 92  Temp: (!) 97.2 F (36.2 C)  SpO2: 98%  Weight: 183 lb (83 kg)  Height: _0  (1.753 m)   Body mass index is 27.02 kg/m.   Physical Exam Constitutional:      General: She is not in acute distress.    Appearance: Normal appearance. She is not ill-appearing.  HENT:     Head: Normocephalic.     Right Ear: Tympanic membrane, ear canal and external ear normal. There is no impacted cerumen.     Left Ear: Tympanic membrane, ear canal and external ear normal. There is no impacted cerumen.  Eyes:     Extraocular Movements: Extraocular movements intact.     Pupils:  Pupils are equal, round, and reactive to light.  Cardiovascular:     Rate and Rhythm: Normal rate and regular rhythm.     Pulses: Normal pulses.     Heart sounds: Normal heart sounds. No murmur heard. No friction rub. No gallop.   Pulmonary:     Effort: Pulmonary effort is normal. No respiratory distress.     Breath sounds: Normal breath sounds. No stridor. No wheezing, rhonchi or rales.  Abdominal:     General: Bowel sounds are normal.     Palpations: Abdomen is soft.     Tenderness: There is no abdominal tenderness.  Musculoskeletal:     Right lower leg: No edema.  Left lower leg: No edema.  Skin:    General: Skin is warm and dry.  Neurological:     Mental Status: She is alert and oriented to person, place, and time.  Psychiatric:        Mood and Affect: Mood normal.        Behavior: Behavior normal.     No results found for this or any previous visit (from the past 24 hour(s)).  No results found.   ASSESSMENT and PLAN  Problem List Items Addressed This Visit      Cardiovascular and Mediastinum   Essential hypertension, benign - Primary   Relevant Orders   CMP14+EGFR     Endocrine   Type 2 diabetes mellitus with hyperglycemia, without long-term current use of insulin (HCC)   Relevant Medications   sitaGLIPtin (JANUVIA) 25 MG tablet   Other Relevant Orders   Hemoglobin A1c   Hyperlipidemia associated with type 2 diabetes mellitus ([place])   Relevant Medications   sitaGLIPtin (JANUVIA) 25 MG tablet   Other Relevant Orders   Lipid Panel    Other Visit Diagnoses    Encounter for screening mammogram for malignant neoplasm of breast       Relevant Orders   MM 3D SCREEN BREAST BILATERAL          Stop Trulicity and Start Januvia Will follow up with labs in 3 months  Return in about 3 months (around 02/28/2021).   Huston Foley Amine Adelson, FNP-BC Primary [facility] at [place], [place] [address] Ph.  [phone] Fax [phone]

## 2020-11-28 NOTE — Patient Instructions (Addendum)
  Health Maintenance, Female Adopting a healthy lifestyle and getting preventive care are important in promoting health and wellness. Ask your health care provider about:  The right schedule for you to have regular tests and exams.  Things you can do on your own to prevent diseases and keep yourself healthy. What should I know about diet, weight, and exercise? Eat a healthy diet  Eat a diet that includes plenty of vegetables, fruits, low-fat dairy products, and lean protein.  Do not eat a lot of foods that are high in solid fats, added sugars, or sodium.   Maintain a healthy weight Body mass index (BMI) is used to identify weight problems. It estimates body fat based on height and weight. Your health care provider can help determine your BMI and help you achieve or maintain a healthy weight. Get regular exercise Get regular exercise. This is one of the most important things you can do for your health. Most adults should:  Exercise for at least 150 minutes each week. The exercise should increase your heart rate and make you sweat (moderate-intensity exercise).  Do strengthening exercises at least twice a week. This is in addition to the moderate-intensity exercise.  Spend less time sitting. Even light physical activity can be beneficial. Watch cholesterol and blood lipids Have your blood tested for lipids and cholesterol at 62 years of age, then have this test every 5 years. Have your cholesterol levels checked more often if:  Your lipid or cholesterol levels are high.  You are older than 62 years of age.  You are at high risk for heart disease. What should I know about cancer screening? Depending on your health history and family history, you may need to have cancer screening at various ages. This may include screening for:  Breast cancer.  Cervical cancer.  Colorectal cancer.  Skin cancer.  Lung cancer. What should I know about heart disease, diabetes, and high blood  pressure? Blood pressure and heart disease  High blood pressure causes heart disease and increases the risk of stroke. This is more likely to develop in people who have high blood pressure readings, are of African descent, or are overweight.  Have your blood pressure checked: ? Every 3-5 years if you are 18-39 years of age. ? Every year if you are 40 years old or older. Diabetes Have regular diabetes screenings. This checks your fasting blood sugar level. Have the screening done:  Once every three years after age 40 if you are at a normal weight and have a low risk for diabetes.  More often and at a younger age if you are overweight or have a high risk for diabetes. What should I know about preventing infection? Hepatitis B If you have a higher risk for hepatitis B, you should be screened for this virus. Talk with your health care provider to find out if you are at risk for hepatitis B infection. Hepatitis C Testing is recommended for:  Everyone born from 1945 through 1965.  Anyone with known risk factors for hepatitis C. Sexually transmitted infections (STIs)  Get screened for STIs, including gonorrhea and chlamydia, if: ? You are sexually active and are younger than 62 years of age. ? You are older than 62 years of age and your health care provider tells you that you are at risk for this type of infection. ? Your sexual activity has changed since you were last screened, and you are at increased risk for chlamydia or gonorrhea. Ask your health   care provider if you are at risk.  Ask your health care provider about whether you are at high risk for HIV. Your health care provider may recommend a prescription medicine to help prevent HIV infection. If you choose to take medicine to prevent HIV, you should first get tested for HIV. You should then be tested every 3 months for as long as you are taking the medicine. Pregnancy  If you are about to stop having your period (premenopausal) and  you may become pregnant, seek counseling before you get pregnant.  Take 400 to 800 micrograms (mcg) of folic acid every day if you become pregnant.  Ask for birth control (contraception) if you want to prevent pregnancy. Osteoporosis and menopause Osteoporosis is a disease in which the bones lose minerals and strength with aging. This can result in bone fractures. If you are 65 years old or older, or if you are at risk for osteoporosis and fractures, ask your health care provider if you should:  Be screened for bone loss.  Take a calcium or vitamin D supplement to lower your risk of fractures.  Be given hormone replacement therapy (HRT) to treat symptoms of menopause. Follow these instructions at home: Lifestyle  Do not use any products that contain nicotine or tobacco, such as cigarettes, e-cigarettes, and chewing tobacco. If you need help quitting, ask your health care provider.  Do not use street drugs.  Do not share needles.  Ask your health care provider for help if you need support or information about quitting drugs. Alcohol use  Do not drink alcohol if: ? Your health care provider tells you not to drink. ? You are pregnant, may be pregnant, or are planning to become pregnant.  If you drink alcohol: ? Limit how much you use to 0-1 drink a day. ? Limit intake if you are breastfeeding.  Be aware of how much alcohol is in your drink. In the [place] one drink equals one 12 oz bottle of beer (355 mL), one 5 oz glass of wine (148 mL), or one 1 oz glass of hard liquor (44 mL). General instructions  Schedule regular health, dental, and eye exams.  Stay current with your vaccines.  Tell your health care provider if: ? You often feel depressed. ? You have ever been abused or do not feel safe at home. Summary  Adopting a healthy lifestyle and getting preventive care are important in promoting health and wellness.  Follow your health care provider's instructions about healthy  diet, exercising, and getting tested or screened for diseases.  Follow your health care provider's instructions on monitoring your cholesterol and blood pressure. This information is not intended to replace advice given to you by your health care provider. Make sure you discuss any questions you have with your health care provider. Document Revised: 09/03/2018 Document Reviewed: 09/03/2018 Elsevier Patient Education  2021 Elsevier Inc.   If you have lab work done today you will be contacted with your lab results within the next 2 weeks.  If you have not heard from us then please contact us. The fastest way to get your results is to register for My Chart.   IF you received an x-ray today, you will receive an invoice from [facility]. Please contact [facility] at [phone] with questions or concerns regarding your invoice.   IF you received labwork today, you will receive an invoice from [facility]. Please contact [facility] at [phone] with questions or concerns regarding your invoice.   Our billing   staff will not be able to assist you with questions regarding bills from these companies.  You will be contacted with the lab results as soon as they are available. The fastest way to get your results is to activate your My Chart account. Instructions are located on the last page of this paperwork. If you have not heard from us regarding the results in 2 weeks, please contact this office.      

## 2020-11-29 LAB — LIPID PANEL
Chol/HDL Ratio: 2.2 ratio (ref 0.0–4.4)
Cholesterol, Total: 103 mg/dL (ref 100–199)
HDL: 47 mg/dL (ref >39)
LDL Chol Calc (NIH): 45 mg/dL (ref 0–99)
Triglycerides: 41 mg/dL (ref 0–149)
VLDL Cholesterol Cal: 11 mg/dL (ref 5–40)

## 2020-11-29 LAB — CMP14+EGFR
ALT: 19 IU/L (ref 0–32)
AST: 14 IU/L (ref 0–40)
Albumin/Globulin Ratio: 1.7 (ref 1.2–2.2)
Albumin: 4.2 g/dL (ref 3.8–4.8)
Alkaline Phosphatase: 60 IU/L (ref 44–121)
BUN/Creatinine Ratio: 22 (ref 12–28)
BUN: 14 mg/dL (ref 8–27)
Bilirubin Total: 0.5 mg/dL (ref 0.0–1.2)
CO2: 22 mmol/L (ref 20–29)
Calcium: 9.7 mg/dL (ref 8.7–10.3)
Chloride: 101 mmol/L (ref 96–106)
Creatinine, Ser: 0.65 mg/dL (ref 0.57–1.00)
Globulin, Total: 2.5 g/dL (ref 1.5–4.5)
Glucose: 318 mg/dL — ABNORMAL HIGH (ref 65–99)
Potassium: 4.6 mmol/L (ref 3.5–5.2)
Sodium: 142 mmol/L (ref 134–144)
Total Protein: 6.7 g/dL (ref 6.0–8.5)
eGFR: 100 mL/min/{1.73_m2} (ref >59)

## 2020-11-29 LAB — HEMOGLOBIN A1C
Est. average glucose Bld gHb Est-mCnc: 214 mg/dL
Hgb A1c MFr Bld: 9.1 % — ABNORMAL HIGH (ref 4.8–5.6)

## 2020-11-29 NOTE — Progress Notes (Signed)
A1c increased from 7.9 to 9.1. We started Januvia at your appointment. Please continue to watch your diet. Let me know if your blood sugars remain high on the new medication. The rest of the labs look good.

## 2020-12-23 HISTORY — PX: CATARACT EXTRACTION: SUR2

## 2021-01-04 ENCOUNTER — Other Ambulatory Visit: Payer: Self-pay

## 2021-01-04 ENCOUNTER — Ambulatory Visit (INDEPENDENT_AMBULATORY_CARE_PROVIDER_SITE_OTHER): Payer: Medicare Other | Admitting: Nurse Practitioner

## 2021-01-04 ENCOUNTER — Encounter: Payer: Self-pay | Admitting: Nurse Practitioner

## 2021-01-04 VITALS — BP 127/63 | HR 85 | Temp 97.7°F | Ht 69.0 in | Wt 186.4 lb

## 2021-01-04 DIAGNOSIS — E1169 Type 2 diabetes mellitus with other specified complication: Secondary | ICD-10-CM

## 2021-01-04 DIAGNOSIS — I10 Essential (primary) hypertension: Secondary | ICD-10-CM

## 2021-01-04 DIAGNOSIS — Z86010 Personal history of colonic polyps: Secondary | ICD-10-CM

## 2021-01-04 DIAGNOSIS — Z1211 Encounter for screening for malignant neoplasm of colon: Secondary | ICD-10-CM

## 2021-01-04 DIAGNOSIS — E1165 Type 2 diabetes mellitus with hyperglycemia: Secondary | ICD-10-CM

## 2021-01-04 DIAGNOSIS — Z7689 Persons encountering health services in other specified circumstances: Secondary | ICD-10-CM

## 2021-01-04 DIAGNOSIS — E785 Hyperlipidemia, unspecified: Secondary | ICD-10-CM

## 2021-01-04 DIAGNOSIS — F331 Major depressive disorder, recurrent, moderate: Secondary | ICD-10-CM

## 2021-01-04 DIAGNOSIS — M349 Systemic sclerosis, unspecified: Secondary | ICD-10-CM

## 2021-01-04 MED ORDER — ACCU-CHEK SOFTCLIX LANCETS MISC: 3 refills | Status: DC

## 2021-01-04 MED ORDER — ACCU-CHEK GUIDE VI STRP: ORAL_STRIP | 12 refills | Status: DC

## 2021-01-04 MED ORDER — ACCU-CHEK GUIDE W/DEVICE KIT: PACK | 0 refills | Status: DC
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
## 2021-01-04 NOTE — Patient Instructions (Signed)
Health Maintenance, Female Adopting a healthy lifestyle and getting preventive care are important in promoting health and wellness. Ask your health care provider about:  The right schedule for you to have regular tests and exams.  Things you can do on your own to prevent diseases and keep yourself healthy. What should I know about diet, weight, and exercise? Eat a healthy diet  Eat a diet that includes plenty of vegetables, fruits, low-fat dairy products, and lean protein.  Do not eat a lot of foods that are high in solid fats, added sugars, or sodium.   Maintain a healthy weight Body mass index (BMI) is used to identify weight problems. It estimates body fat based on height and weight. Your health care provider can help determine your BMI and help you achieve or maintain a healthy weight. Get regular exercise Get regular exercise. This is one of the most important things you can do for your health. Most adults should:  Exercise for at least 150 minutes each week. The exercise should increase your heart rate and make you sweat (moderate-intensity exercise).  Do strengthening exercises at least twice a week. This is in addition to the moderate-intensity exercise.  Spend less time sitting. Even light physical activity can be beneficial. Watch cholesterol and blood lipids Have your blood tested for lipids and cholesterol at 62 years of age, then have this test every 5 years. Have your cholesterol levels checked more often if:  Your lipid or cholesterol levels are high.  You are older than 62 years of age.  You are at high risk for heart disease. What should I know about cancer screening? Depending on your health history and family history, you may need to have cancer screening at various ages. This may include screening for:  Breast cancer.  Cervical cancer.  Colorectal cancer.  Skin cancer.  Lung cancer. What should I know about heart disease, diabetes, and high blood  pressure? Blood pressure and heart disease  High blood pressure causes heart disease and increases the risk of stroke. This is more likely to develop in people who have high blood pressure readings, are of African descent, or are overweight.  Have your blood pressure checked: ? Every 3-5 years if you are 18-39 years of age. ? Every year if you are 40 years old or older. Diabetes Have regular diabetes screenings. This checks your fasting blood sugar level. Have the screening done:  Once every three years after age 40 if you are at a normal weight and have a low risk for diabetes.  More often and at a younger age if you are overweight or have a high risk for diabetes. What should I know about preventing infection? Hepatitis B If you have a higher risk for hepatitis B, you should be screened for this virus. Talk with your health care provider to find out if you are at risk for hepatitis B infection. Hepatitis C Testing is recommended for:  Everyone born from 1945 through 1965.  Anyone with known risk factors for hepatitis C. Sexually transmitted infections (STIs)  Get screened for STIs, including gonorrhea and chlamydia, if: ? You are sexually active and are younger than 62 years of age. ? You are older than 62 years of age and your health care provider tells you that you are at risk for this type of infection. ? Your sexual activity has changed since you were last screened, and you are at increased risk for chlamydia or gonorrhea. Ask your health care provider   if you are at risk.  Ask your health care provider about whether you are at high risk for HIV. Your health care provider may recommend a prescription medicine to help prevent HIV infection. If you choose to take medicine to prevent HIV, you should first get tested for HIV. You should then be tested every 3 months for as long as you are taking the medicine. Pregnancy  If you are about to stop having your period (premenopausal) and  you may become pregnant, seek counseling before you get pregnant.  Take 400 to 800 micrograms (mcg) of folic acid every day if you become pregnant.  Ask for birth control (contraception) if you want to prevent pregnancy. Osteoporosis and menopause Osteoporosis is a disease in which the bones lose minerals and strength with aging. This can result in bone fractures. If you are 65 years old or older, or if you are at risk for osteoporosis and fractures, ask your health care provider if you should:  Be screened for bone loss.  Take a calcium or vitamin D supplement to lower your risk of fractures.  Be given hormone replacement therapy (HRT) to treat symptoms of menopause. Follow these instructions at home: Lifestyle  Do not use any products that contain nicotine or tobacco, such as cigarettes, e-cigarettes, and chewing tobacco. If you need help quitting, ask your health care provider.  Do not use street drugs.  Do not share needles.  Ask your health care provider for help if you need support or information about quitting drugs. Alcohol use  Do not drink alcohol if: ? Your health care provider tells you not to drink. ? You are pregnant, may be pregnant, or are planning to become pregnant.  If you drink alcohol: ? Limit how much you use to 0-1 drink a day. ? Limit intake if you are breastfeeding.  Be aware of how much alcohol is in your drink. In the [place] one drink equals one 12 oz bottle of beer (355 mL), one 5 oz glass of wine (148 mL), or one 1 oz glass of hard liquor (44 mL). General instructions  Schedule regular health, dental, and eye exams.  Stay current with your vaccines.  Tell your health care provider if: ? You often feel depressed. ? You have ever been abused or do not feel safe at home. Summary  Adopting a healthy lifestyle and getting preventive care are important in promoting health and wellness.  Follow your health care provider's instructions about healthy  diet, exercising, and getting tested or screened for diseases.  Follow your health care provider's instructions on monitoring your cholesterol and blood pressure. This information is not intended to replace advice given to you by your health care provider. Make sure you discuss any questions you have with your health care provider. Document Revised: 09/03/2018 Document Reviewed: 09/03/2018 Elsevier Patient Education  2021 Elsevier Inc.  

## 2021-01-05 ENCOUNTER — Encounter: Payer: Self-pay | Admitting: Nurse Practitioner

## 2021-01-10 ENCOUNTER — Encounter: Payer: Medicare Other | Admitting: Family Medicine

## 2021-01-20 ENCOUNTER — Telehealth: Payer: Self-pay | Admitting: Nurse Practitioner

## 2021-01-20 ENCOUNTER — Other Ambulatory Visit: Payer: Self-pay | Admitting: Nurse Practitioner

## 2021-01-20 DIAGNOSIS — E1165 Type 2 diabetes mellitus with hyperglycemia: Secondary | ICD-10-CM

## 2021-01-20 MED ORDER — GLIPIZIDE 10 MG PO TABS: 10.0000 mg | ORAL_TABLET | Freq: Two times a day (BID) | ORAL | 1 refills | Status: DC

## 2021-01-20 NOTE — Telephone Encounter (Signed)
Sent but she had refills

## 2021-01-31 ENCOUNTER — Other Ambulatory Visit: Payer: Self-pay | Admitting: Nurse Practitioner

## 2021-01-31 ENCOUNTER — Telehealth: Payer: Self-pay

## 2021-01-31 DIAGNOSIS — E1165 Type 2 diabetes mellitus with hyperglycemia: Secondary | ICD-10-CM

## 2021-01-31 MED ORDER — METFORMIN HCL 1000 MG PO TABS: ORAL_TABLET | ORAL | 3 refills | Status: DC

## 2021-01-31 NOTE — Telephone Encounter (Signed)
Med refill:  Metformin [facility] [address]

## 2021-01-31 NOTE — Telephone Encounter (Signed)
Medication was refilled.

## 2021-02-15 ENCOUNTER — Ambulatory Visit: Payer: Medicare Other | Admitting: Nurse Practitioner

## 2021-02-15 ENCOUNTER — Other Ambulatory Visit: Payer: Medicare PPO

## 2021-02-24 ENCOUNTER — Other Ambulatory Visit: Payer: Self-pay

## 2021-02-24 ENCOUNTER — Ambulatory Visit
Admission: RE | Admit: 2021-02-24 | Discharge: 2021-02-24 | Disposition: A | Discharge status: Home / Self Care | Payer: Medicare Other | Source: Ambulatory Visit | Attending: Family Medicine | Admitting: Family Medicine

## 2021-02-24 DIAGNOSIS — E2839 Other primary ovarian failure: Secondary | ICD-10-CM

## 2021-02-27 ENCOUNTER — Other Ambulatory Visit: Payer: Self-pay

## 2021-02-27 ENCOUNTER — Ambulatory Visit (INDEPENDENT_AMBULATORY_CARE_PROVIDER_SITE_OTHER): Payer: Medicare Other | Admitting: Internal Medicine

## 2021-02-27 ENCOUNTER — Encounter: Payer: Self-pay | Admitting: Internal Medicine

## 2021-02-27 VITALS — BP 119/70 | HR 83 | Ht 68.25 in | Wt 185.8 lb

## 2021-02-27 DIAGNOSIS — M349 Systemic sclerosis, unspecified: Secondary | ICD-10-CM | POA: Diagnosis not present

## 2021-02-27 DIAGNOSIS — E1165 Type 2 diabetes mellitus with hyperglycemia: Secondary | ICD-10-CM
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
## 2021-03-02 LAB — ANTI-SCLERODERMA ANTIBODY: Scleroderma (Scl-70) (ENA) Antibody, IgG: <1 AI

## 2021-03-02 LAB — ANA: Anti Nuclear Antibody (ANA): NEGATIVE

## 2021-03-02 LAB — CENTROMERE ANTIBODIES: Centromere Ab Screen: <1 AI

## 2021-03-06 ENCOUNTER — Other Ambulatory Visit: Payer: Self-pay | Admitting: Nurse Practitioner

## 2021-03-06 ENCOUNTER — Telehealth: Payer: Self-pay

## 2021-03-06 MED ORDER — ATORVASTATIN CALCIUM 20 MG PO TABS: 20.0000 mg | ORAL_TABLET | Freq: Every day | ORAL | 3 refills | Status: DC

## 2021-03-06 NOTE — Telephone Encounter (Signed)
atorvastatin

## 2021-03-13 ENCOUNTER — Telehealth: Payer: Self-pay

## 2021-03-13 DIAGNOSIS — M349 Systemic sclerosis, unspecified: Secondary | ICD-10-CM

## 2021-03-13 NOTE — Telephone Encounter (Signed)
I spoke with Jennifer Rhodes about her results show no evidence for scleroderma. I discussed scleredema is a skin condition related to longstanding diabetes this is not a progressive disease that involves body organs. She needs to optimize diabetes control to prevent worsening of skin over time. I recommended referral to dermatology for possible treatment with phototherapy she is agreeable to this, order placed today. Could consider [facility] [place] or maybe [facility] for this.

## 2021-03-13 NOTE — Telephone Encounter (Signed)
Patient called requesting a return call with her labwork results. 

## 2021-03-24 ENCOUNTER — Other Ambulatory Visit: Payer: Self-pay | Admitting: Nurse Practitioner

## 2021-03-24 DIAGNOSIS — Z1231 Encounter for screening mammogram for malignant neoplasm of breast: Secondary | ICD-10-CM

## 2021-03-28 DIAGNOSIS — E7800 Pure hypercholesterolemia, unspecified: Secondary | ICD-10-CM | POA: Diagnosis not present

## 2021-03-28 DIAGNOSIS — Z8249 Family history of ischemic heart disease and other diseases of the circulatory system: Secondary | ICD-10-CM | POA: Diagnosis not present

## 2021-03-28 DIAGNOSIS — I10 Essential (primary) hypertension: Secondary | ICD-10-CM | POA: Diagnosis not present

## 2021-03-28 DIAGNOSIS — Z8241 Family history of sudden cardiac death: Secondary | ICD-10-CM | POA: Diagnosis not present

## 2021-04-03 ENCOUNTER — Telehealth: Payer: Self-pay

## 2021-04-03 NOTE — Telephone Encounter (Signed)
Lisinapril 40 mg [facility] [address]

## 2021-04-04 NOTE — Telephone Encounter (Signed)
Let voicemail for patient to call back

## 2021-04-05 ENCOUNTER — Ambulatory Visit (INDEPENDENT_AMBULATORY_CARE_PROVIDER_SITE_OTHER): Payer: Medicare Other | Admitting: Nurse Practitioner

## 2021-04-05 ENCOUNTER — Encounter: Payer: Self-pay | Admitting: Nurse Practitioner

## 2021-04-05 ENCOUNTER — Other Ambulatory Visit: Payer: Self-pay

## 2021-04-05 VITALS — BP 116/65 | HR 77 | Temp 97.0°F | Ht 69.0 in | Wt 183.1 lb

## 2021-04-05 DIAGNOSIS — F330 Major depressive disorder, recurrent, mild: Secondary | ICD-10-CM | POA: Diagnosis not present

## 2021-04-05 DIAGNOSIS — E1165 Type 2 diabetes mellitus with hyperglycemia: Secondary | ICD-10-CM | POA: Diagnosis not present

## 2021-04-05 DIAGNOSIS — I10 Essential (primary) hypertension: Secondary | ICD-10-CM

## 2021-04-05 LAB — POCT GLYCOSYLATED HEMOGLOBIN (HGB A1C)
HbA1c POC (<> result, manual entry): 6.2 %
HbA1c, POC (controlled diabetic range): 6.2 % (ref 0.0–7.0)
HbA1c, POC (prediabetic range): 6.2 % (ref 5.7–6.4)
Hemoglobin A1C: 6.2 % — AB (ref 4.0–5.6)

## 2021-04-05 MED ORDER — SITAGLIPTIN PHOSPHATE 25 MG PO TABS: 25.0000 mg | ORAL_TABLET | Freq: Every day | ORAL | 3 refills | Status: DC

## 2021-04-05 MED ORDER — ACCU-CHEK GUIDE VI STRP: 1.0000 | ORAL_STRIP | Freq: Two times a day (BID) | 3 refills | Status: DC

## 2021-04-05 MED ORDER — CITALOPRAM HYDROBROMIDE 40 MG PO TABS: 40.0000 mg | ORAL_TABLET | Freq: Every day | ORAL | 3 refills | Status: DC

## 2021-04-05 NOTE — Patient Instructions (Addendum)
Diabetes Mellitus and Exercise Exercising regularly is important for overall health, especially for people who have diabetes mellitus. Exercising is not only about losing weight. It has many other health benefits, such as increasing muscle strength and bone density and reducing body fat and stress. This leads to improved fitness, flexibility, andendurance, all of which result in better overall health. What are the benefits of exercise if I have diabetes? Exercise has many benefits for people with diabetes. They include: Helping to lower and control blood sugar (glucose). Helping the body to respond better to the hormone insulin by improving insulin sensitivity. Reducing how much insulin the body needs. Lowering the risk for heart disease by: Lowering "bad" cholesterol and triglyceride levels. Increasing "good" cholesterol levels. Lowering blood pressure. Lowering blood glucose levels. What is my activity plan? Your health care provider or certified diabetes educator can help you make a plan for the type and frequency of exercise that works for you. This is called your activity plan. Be sure to: Get at least 150 minutes of medium-intensity or high-intensity exercise each week. Exercises may include brisk walking, biking, or water aerobics. Do stretching and strengthening exercises, such as yoga or weight lifting, at least 2 times a week. Spread out your activity over at least 3 days of the week. Get some form of physical activity each day. Do not go more than 2 days in a row without some kind of physical activity. Avoid being inactive for more than 90 minutes at a time. Take frequent breaks to walk or stretch. Choose exercises or activities that you enjoy. Set realistic goals. Start slowly and gradually increase your exercise intensity over time. How do I manage my diabetes during exercise?  Monitor your blood glucose Check your blood glucose before and after exercising. If your blood glucose  is: 240 mg/dL (17.0 mmol/L) or higher before you exercise, check your urine for ketones. These are chemicals created by the liver. If you have ketones in your urine, do not exercise until your blood glucose returns to normal. 100 mg/dL (5.6 mmol/L) or lower, eat a snack containing 15-20 grams of carbohydrate. Check your blood glucose 15 minutes after the snack to make sure that your glucose level is above 100 mg/dL (5.6 mmol/L) before you start your exercise. Know the symptoms of low blood glucose (hypoglycemia) and how to treat it. Your risk for hypoglycemia increases during and after exercise. Follow these tips and your health care provider's instructions Keep a carbohydrate snack that is fast-acting for use before, during, and after exercise to help prevent or treat hypoglycemia. Avoid injecting insulin into areas of the body that are going to be exercised. For example, avoid injecting insulin into: Your arms, when you are about to play tennis. Your legs, when you are about to go jogging. Keep records of your exercise habits. Doing this can help you and your health care provider adjust your diabetes management plan as needed. Write down: Food that you eat before and after you exercise. Blood glucose levels before and after you exercise. The type and amount of exercise you have done. Work with your health care provider when you start a new exercise or activity. He or she may need to: Make sure that the activity is safe for you. Adjust your insulin, other medicines, and food that you eat. Drink plenty of water while you exercise. This prevents loss of water (dehydration) and problems caused by a lot of heat in the body (heat stroke). Where to find more information  American Diabetes Association: www.diabetes.org Summary Exercising regularly is important for overall health, especially for people who have diabetes mellitus. Exercising has many health benefits. It increases muscle strength and bone  density and reduces body fat and stress. It also lowers and controls blood glucose. Your health care provider or certified diabetes educator can help you make an activity plan for the type and frequency of exercise that works for you. Work with your health care provider to make sure any new activity is safe for you. Also work with your health care provider to adjust your insulin, other medicines, and the food you eat. This information is not intended to replace advice given to you by your health care provider. Make sure you discuss any questions you have with your healthcare provider. Document Revised: 06/08/2019 Document Reviewed: 06/08/2019 Elsevier Patient Education  2022 Else[employer] Inc.  Diet for Irritable Bowel Syndrome When you have irritable bowel syndrome (IBS), it is very important to eat the foods and follow the eating habits that are best for your condition. IBS may cause various symptoms such as pain in the abdomen, constipation, or diarrhea. Choosing the right foods can help to ease the discomfort from these symptoms. Work with your health care provider and diet and nutrition specialist (dietitian) to find the eating plan that will help to control your symptoms. What are tips for following this plan?     Keep a food diary. This will help you identify foods that cause symptoms. Write down: What you eat and when you eat it. What symptoms you have. When symptoms occur in relation to your meals, such as "pain in abdomen 2 hours after dinner." Eat your meals slowly and in a relaxed setting. Aim to eat 5-6 small meals per day. Do not skip meals. Drink enough fluid to keep your urine pale yellow. Ask your health care provider if you should take an over-the-counter probiotic to help restore healthy bacteria in your gut (digestive tract). Probiotics are foods that contain good bacteria and yeasts. Your dietitian may have specific dietary recommendations for you based on your symptoms. He or  she may recommend that you: Avoid foods that cause symptoms. Talk with your dietitian about other ways to get the same nutrients that are in those problem foods. Avoid foods with gluten. Gluten is a protein that is found in rye, wheat, and barley. Eat more foods that contain soluble fiber. Examples of foods with high soluble fiber include oats, seeds, and certain fruits and vegetables. Take a fiber supplement if directed by your dietitian. Reduce or avoid certain foods called FODMAPs. These are foods that contain carbohydrates that are hard to digest. Ask your doctor which foods contain these carbohydrates. What foods are not recommended? The following are some foods and drinks that may make your symptoms worse: Fatty foods, such as french fries. Foods that contain gluten, such as pasta and cereal. Dairy products, such as milk, cheese, and ice cream. Chocolate. Alcohol. Products with caffeine, such as coffee. Carbonated drinks, such as soda. Foods that are high in FODMAPs. These include certain fruits and vegetables. Products with sweeteners such as honey, high fructose corn syrup, sorbitol, and mannitol. The items listed above may not be a complete list of foods and beverages youshould avoid. Contact a dietitian for more information. What foods are good sources of fiber? Your health care provider or dietitian may recommend that you eat more foods that contain fiber. Fiber can help to reduce constipation and other IBS symptoms. Add foods with  fiber to your diet a little at a time so your body can get used to them. Too much fiber at one time might cause gas and swelling of your abdomen. The following are some foods that are good sources of fiber: Berries, such as raspberries, strawberries, and blueberries. Tomatoes. Carrots. Brown rice. Oats. Seeds, such as chia and pumpkin seeds. The items listed above may not be a complete list of recommended sources offiber. Contact your dietitian for more  options. Where to find more information International Foundation for Functional Gastrointestinal Disorders: www.iffgd.[employer] of Diabetes and Digestive and Kidney Diseases: CarFlippers.tn Summary When you have irritable bowel syndrome (IBS), it is very important to eat the foods and follow the eating habits that are best for your condition. IBS may cause various symptoms such as pain in the abdomen, constipation, or diarrhea. Choosing the right foods can help to ease the discomfort that comes from symptoms. Keep a food diary. This will help you identify foods that cause symptoms. Your health care provider or diet and nutrition specialist (dietitian) may recommend that you eat more foods that contain fiber. This information is not intended to replace advice given to you by your health care provider. Make sure you discuss any questions you have with your healthcare provider. Document Revised: 05/12/2020 Document Reviewed: 05/12/2020 Elsevier Patient Education  2022 [employer].
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
## 2021-04-05 NOTE — Telephone Encounter (Signed)
Patient was asking for citalopram, she was in office today refills sent.

## 2021-04-11 DIAGNOSIS — E1165 Type 2 diabetes mellitus with hyperglycemia: Secondary | ICD-10-CM | POA: Diagnosis not present

## 2021-04-24 ENCOUNTER — Other Ambulatory Visit: Payer: Self-pay

## 2021-04-24 DIAGNOSIS — E1165 Type 2 diabetes mellitus with hyperglycemia: Secondary | ICD-10-CM

## 2021-04-24 DIAGNOSIS — F330 Major depressive disorder, recurrent, mild: Secondary | ICD-10-CM

## 2021-04-24 MED ORDER — GLIPIZIDE 10 MG PO TABS
10.0000 mg | ORAL_TABLET | Freq: Two times a day (BID) | ORAL | 1 refills | Status: DC
Start: 2021-04-24 — End: 2021-10-11

## 2021-04-24 MED ORDER — SITAGLIPTIN PHOSPHATE 25 MG PO TABS: 25.0000 mg | ORAL_TABLET | Freq: Every day | ORAL | 3 refills | Status: DC

## 2021-04-24 MED ORDER — CITALOPRAM HYDROBROMIDE 40 MG PO TABS: 40.0000 mg | ORAL_TABLET | Freq: Every day | ORAL | 3 refills | Status: DC

## 2021-04-24 MED ORDER — ACCU-CHEK GUIDE W/DEVICE KIT: PACK | 0 refills | Status: DC

## 2021-05-08 ENCOUNTER — Telehealth: Payer: Self-pay

## 2021-05-08 NOTE — Telephone Encounter (Signed)
Metformin refill to change to 500 mg twice daily   [facility] [address]

## 2021-05-09 ENCOUNTER — Other Ambulatory Visit: Payer: Self-pay | Admitting: Nurse Practitioner

## 2021-05-09 DIAGNOSIS — E1165 Type 2 diabetes mellitus with hyperglycemia: Secondary | ICD-10-CM

## 2021-05-09 MED ORDER — METFORMIN HCL 1000 MG PO TABS: ORAL_TABLET | ORAL | 3 refills | Status: DC

## 2021-05-12 DIAGNOSIS — E1165 Type 2 diabetes mellitus with hyperglycemia: Secondary | ICD-10-CM | POA: Diagnosis not present

## 2021-05-18 ENCOUNTER — Other Ambulatory Visit: Payer: Self-pay

## 2021-05-18 ENCOUNTER — Ambulatory Visit
Admission: RE | Admit: 2021-05-18 | Discharge: 2021-05-18 | Disposition: A | Discharge status: Home / Self Care | Payer: Medicare Other | Source: Ambulatory Visit | Attending: Nurse Practitioner | Admitting: Nurse Practitioner

## 2021-05-18 DIAGNOSIS — Z1231 Encounter for screening mammogram for malignant neoplasm of breast: Secondary | ICD-10-CM | POA: Diagnosis not present

## 2021-06-01 ENCOUNTER — Other Ambulatory Visit: Payer: Self-pay | Admitting: Nurse Practitioner

## 2021-06-01 DIAGNOSIS — R928 Other abnormal and inconclusive findings on diagnostic imaging of breast: Secondary | ICD-10-CM

## 2021-06-07 DIAGNOSIS — H2511 Age-related nuclear cataract, right eye: Secondary | ICD-10-CM | POA: Diagnosis not present

## 2021-06-09 DIAGNOSIS — H25811 Combined forms of age-related cataract, right eye: Secondary | ICD-10-CM | POA: Diagnosis not present

## 2021-06-14 ENCOUNTER — Telehealth: Payer: Self-pay | Admitting: Nurse Practitioner

## 2021-06-14 NOTE — Telephone Encounter (Signed)
Left message for patient to call back and schedule Medicare Annual Wellness Visit (AWV)   Last AWVI 08/04/2019  ; please schedule at anytime with health coach

## 2021-06-16 DIAGNOSIS — E1165 Type 2 diabetes mellitus with hyperglycemia: Secondary | ICD-10-CM | POA: Diagnosis not present

## 2021-06-20 ENCOUNTER — Ambulatory Visit: Payer: Medicare Other | Admitting: Physician Assistant

## 2021-06-21 ENCOUNTER — Other Ambulatory Visit: Payer: Self-pay | Admitting: Nurse Practitioner

## 2021-06-21 ENCOUNTER — Ambulatory Visit
Admission: RE | Admit: 2021-06-21 | Discharge: 2021-06-21 | Disposition: A | Discharge status: Home / Self Care | Payer: Medicare Other | Source: Ambulatory Visit | Attending: Nurse Practitioner | Admitting: Nurse Practitioner

## 2021-06-21 ENCOUNTER — Other Ambulatory Visit: Payer: Self-pay

## 2021-06-21 DIAGNOSIS — R922 Inconclusive mammogram: Secondary | ICD-10-CM | POA: Diagnosis not present

## 2021-06-21 DIAGNOSIS — R921 Mammographic calcification found on diagnostic imaging of breast: Secondary | ICD-10-CM

## 2021-06-21 DIAGNOSIS — R928 Other abnormal and inconclusive findings on diagnostic imaging of breast: Secondary | ICD-10-CM

## 2021-06-29 ENCOUNTER — Other Ambulatory Visit: Payer: Self-pay

## 2021-06-29 ENCOUNTER — Ambulatory Visit
Admission: RE | Admit: 2021-06-29 | Discharge: 2021-06-29 | Disposition: A | Discharge status: Home / Self Care | Payer: Medicare Other | Source: Ambulatory Visit | Attending: Nurse Practitioner | Admitting: Nurse Practitioner

## 2021-06-29 ENCOUNTER — Other Ambulatory Visit: Payer: Self-pay | Admitting: General Practice

## 2021-06-29 DIAGNOSIS — N6489 Other specified disorders of breast: Secondary | ICD-10-CM | POA: Diagnosis not present

## 2021-06-29 DIAGNOSIS — R921 Mammographic calcification found on diagnostic imaging of breast: Secondary | ICD-10-CM

## 2021-07-06 ENCOUNTER — Ambulatory Visit: Payer: Medicare Other | Admitting: Nurse Practitioner

## 2021-07-10 LAB — HM DIABETES EYE EXAM

## 2021-07-17 DIAGNOSIS — E1165 Type 2 diabetes mellitus with hyperglycemia: Secondary | ICD-10-CM | POA: Diagnosis not present

## 2021-08-01 ENCOUNTER — Other Ambulatory Visit: Payer: Medicare Other

## 2021-08-02 ENCOUNTER — Ambulatory Visit (INDEPENDENT_AMBULATORY_CARE_PROVIDER_SITE_OTHER): Payer: Medicare Other | Admitting: Nurse Practitioner

## 2021-08-02 ENCOUNTER — Encounter: Payer: Self-pay | Admitting: Nurse Practitioner

## 2021-08-02 ENCOUNTER — Other Ambulatory Visit: Payer: Self-pay

## 2021-08-02 VITALS — BP 122/63 | HR 87 | Temp 97.4°F | Ht 69.0 in | Wt 191.0 lb

## 2021-08-02 DIAGNOSIS — R82998 Other abnormal findings in urine: Secondary | ICD-10-CM | POA: Diagnosis not present

## 2021-08-02 DIAGNOSIS — E1169 Type 2 diabetes mellitus with other specified complication: Secondary | ICD-10-CM | POA: Diagnosis not present

## 2021-08-02 DIAGNOSIS — E1165 Type 2 diabetes mellitus with hyperglycemia: Secondary | ICD-10-CM | POA: Diagnosis not present

## 2021-08-02 DIAGNOSIS — Z23 Encounter for immunization: Secondary | ICD-10-CM | POA: Diagnosis not present

## 2021-08-02 DIAGNOSIS — I10 Essential (primary) hypertension: Secondary | ICD-10-CM | POA: Diagnosis not present

## 2021-08-02 DIAGNOSIS — E785 Hyperlipidemia, unspecified: Secondary | ICD-10-CM

## 2021-08-02 LAB — POCT GLYCOSYLATED HEMOGLOBIN (HGB A1C)
HbA1c POC (<> result, manual entry): 6.7 % (ref 4.0–5.6)
HbA1c, POC (controlled diabetic range): 6.7 % (ref 0.0–7.0)
HbA1c, POC (prediabetic range): 6.7 % — AB (ref 5.7–6.4)
Hemoglobin A1C: 6.7 % — AB (ref 4.0–5.6)

## 2021-08-02 LAB — POCT URINALYSIS DIP (CLINITEK)
Bilirubin, UA: NEGATIVE
Blood, UA: NEGATIVE
Glucose, UA: 250 mg/dL — AB
Ketones, POC UA: NEGATIVE mg/dL
Nitrite, UA: NEGATIVE
POC PROTEIN,UA: NEGATIVE
Spec Grav, UA: 1.025 (ref 1.010–1.025)
Urobilinogen, UA: 1 E.U./dL
pH, UA: 5.5 (ref 5.0–8.0)

## 2021-08-02 LAB — GLUCOSE, POCT (MANUAL RESULT ENTRY): POC Glucose: 210 mg/dl — AB (ref 70–99)

## 2021-08-02 MED ORDER — ACCU-CHEK GUIDE VI STRP: 1.0000 | ORAL_STRIP | Freq: Two times a day (BID) | 3 refills | Status: DC

## 2021-08-02 NOTE — Patient Instructions (Signed)
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
## 2021-08-03 LAB — COMP. METABOLIC PANEL (12)
AST: 21 IU/L (ref 0–40)
Albumin/Globulin Ratio: 1.9 (ref 1.2–2.2)
Albumin: 4.7 g/dL (ref 3.8–4.8)
Alkaline Phosphatase: 63 IU/L (ref 44–121)
BUN/Creatinine Ratio: 23 (ref 12–28)
BUN: 15 mg/dL (ref 8–27)
Bilirubin Total: 0.4 mg/dL (ref 0.0–1.2)
Calcium: 9.6 mg/dL (ref 8.7–10.3)
Chloride: 100 mmol/L (ref 96–106)
Creatinine, Ser: 0.64 mg/dL (ref 0.57–1.00)
Globulin, Total: 2.5 g/dL (ref 1.5–4.5)
Glucose: 112 mg/dL — ABNORMAL HIGH (ref 70–99)
Potassium: 4 mmol/L (ref 3.5–5.2)
Sodium: 144 mmol/L (ref 134–144)
Total Protein: 7.2 g/dL (ref 6.0–8.5)
eGFR: 100 mL/min/{1.73_m2} (ref >59)

## 2021-08-03 LAB — LIPID PANEL
Chol/HDL Ratio: 2 ratio (ref 0.0–4.4)
Cholesterol, Total: 119 mg/dL (ref 100–199)
HDL: 61 mg/dL (ref >39)
LDL Chol Calc (NIH): 48 mg/dL (ref 0–99)
Triglycerides: 40 mg/dL (ref 0–149)
VLDL Cholesterol Cal: 10 mg/dL (ref 5–40)

## 2021-08-07 LAB — URINE CULTURE

## 2021-08-21 DIAGNOSIS — E1165 Type 2 diabetes mellitus with hyperglycemia: Secondary | ICD-10-CM | POA: Diagnosis not present

## 2021-09-20 DIAGNOSIS — E1165 Type 2 diabetes mellitus with hyperglycemia: Secondary | ICD-10-CM | POA: Diagnosis not present

## 2021-10-06 ENCOUNTER — Telehealth: Payer: Self-pay

## 2021-10-06 NOTE — Telephone Encounter (Signed)
Glipizide  Citalpram

## 2021-10-11 ENCOUNTER — Other Ambulatory Visit: Payer: Self-pay

## 2021-10-11 DIAGNOSIS — E1165 Type 2 diabetes mellitus with hyperglycemia: Secondary | ICD-10-CM

## 2021-10-11 DIAGNOSIS — F330 Major depressive disorder, recurrent, mild: Secondary | ICD-10-CM

## 2021-10-11 MED ORDER — CITALOPRAM HYDROBROMIDE 40 MG PO TABS: 40.0000 mg | ORAL_TABLET | Freq: Every day | ORAL | 3 refills | Status: DC

## 2021-10-11 MED ORDER — GLIPIZIDE 10 MG PO TABS: 10.0000 mg | ORAL_TABLET | Freq: Two times a day (BID) | ORAL | 1 refills | Status: DC

## 2021-10-11 NOTE — Telephone Encounter (Signed)
Pt Rx refills have been sent to the provider for review.

## 2021-10-13 ENCOUNTER — Ambulatory Visit (INDEPENDENT_AMBULATORY_CARE_PROVIDER_SITE_OTHER): Payer: Medicare Other | Admitting: Nurse Practitioner

## 2021-10-13 ENCOUNTER — Encounter: Payer: Self-pay | Admitting: Nurse Practitioner

## 2021-10-13 ENCOUNTER — Other Ambulatory Visit: Payer: Self-pay

## 2021-10-13 VITALS — BP 132/66 | HR 87 | Resp 16 | Wt 191.0 lb

## 2021-10-13 DIAGNOSIS — E1165 Type 2 diabetes mellitus with hyperglycemia: Secondary | ICD-10-CM

## 2021-10-13 DIAGNOSIS — R531 Weakness: Secondary | ICD-10-CM

## 2021-10-13 DIAGNOSIS — R6889 Other general symptoms and signs: Secondary | ICD-10-CM

## 2021-10-13 LAB — POCT INFLUENZA A/B
Influenza A, POC: NEGATIVE
Influenza B, POC: NEGATIVE

## 2021-10-13 LAB — GLUCOSE, POCT (MANUAL RESULT ENTRY): POC Glucose: 177 mg/dl — AB (ref 70–99)

## 2021-10-13 NOTE — Patient Instructions (Signed)
You were seen today in the [facility] for flu like symptoms and weakness. Labs were collected, results will be available via MyChart or, if abnormal, you will be contacted by clinic staff. You were prescribed medications, please take as directed. Please follow up as needed
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
## 2021-10-14 LAB — CBC WITH DIFFERENTIAL/PLATELET
Basophils Absolute: 0 10*3/uL (ref 0.0–0.2)
Basos: 1 %
EOS (ABSOLUTE): 0.1 10*3/uL (ref 0.0–0.4)
Eos: 1 %
Hematocrit: 40.2 % (ref 34.0–46.6)
Hemoglobin: 13 g/dL (ref 11.1–15.9)
Immature Grans (Abs): 0 10*3/uL (ref 0.0–0.1)
Immature Granulocytes: 0 %
Lymphocytes Absolute: 2 10*3/uL (ref 0.7–3.1)
Lymphs: 40 %
MCH: 28.3 pg (ref 26.6–33.0)
MCHC: 32.3 g/dL (ref 31.5–35.7)
MCV: 87 fL (ref 79–97)
Monocytes Absolute: 0.4 10*3/uL (ref 0.1–0.9)
Monocytes: 8 %
Neutrophils Absolute: 2.6 10*3/uL (ref 1.4–7.0)
Neutrophils: 50 %
Platelets: 303 10*3/uL (ref 150–450)
RBC: 4.6 x10E6/uL (ref 3.77–5.28)
RDW: 12.7 % (ref 11.7–15.4)
WBC: 5 10*3/uL (ref 3.4–10.8)

## 2021-10-14 LAB — COMPREHENSIVE METABOLIC PANEL
ALT: 28 IU/L (ref 0–32)
AST: 15 IU/L (ref 0–40)
Albumin/Globulin Ratio: 1.6 (ref 1.2–2.2)
Albumin: 4.2 g/dL (ref 3.8–4.8)
Alkaline Phosphatase: 62 IU/L (ref 44–121)
BUN/Creatinine Ratio: 21 (ref 12–28)
BUN: 13 mg/dL (ref 8–27)
Bilirubin Total: 0.4 mg/dL (ref 0.0–1.2)
CO2: 26 mmol/L (ref 20–29)
Calcium: 9.9 mg/dL (ref 8.7–10.3)
Chloride: 102 mmol/L (ref 96–106)
Creatinine, Ser: 0.61 mg/dL (ref 0.57–1.00)
Globulin, Total: 2.7 g/dL (ref 1.5–4.5)
Glucose: 178 mg/dL — ABNORMAL HIGH (ref 70–99)
Potassium: 4.4 mmol/L (ref 3.5–5.2)
Sodium: 141 mmol/L (ref 134–144)
Total Protein: 6.9 g/dL (ref 6.0–8.5)
eGFR: 101 mL/min/{1.73_m2} (ref >59)

## 2021-10-14 LAB — VITAMIN D 25 HYDROXY (VIT D DEFICIENCY, FRACTURES): Vit D, 25-Hydroxy: 51.4 ng/mL (ref 30.0–100.0)

## 2021-10-14 LAB — TSH+T4F+T3FREE
Free T4: 1.15 ng/dL (ref 0.82–1.77)
T3, Free: 2.5 pg/mL (ref 2.0–4.4)
TSH: 1.05 u[IU]/mL (ref 0.450–4.500)

## 2021-10-14 LAB — VITAMIN B12: Vitamin B-12: 1001 pg/mL (ref 232–1245)

## 2021-10-17 ENCOUNTER — Telehealth: Payer: Self-pay

## 2021-10-17 NOTE — Telephone Encounter (Signed)
-----   Message from Teena Dunk, NP sent at 10/16/2021  9:55 AM EST ----- Labs wnl, continue previously discussed treatment plan and maintain upcoming follow up for further evaluation of fatigue

## 2021-10-17 NOTE — Telephone Encounter (Signed)
Pt was called and vm was left, Information has been sent to nurse pool.   

## 2021-11-02 ENCOUNTER — Ambulatory Visit: Payer: Medicare HMO | Admitting: Nurse Practitioner

## 2021-11-10 ENCOUNTER — Ambulatory Visit (INDEPENDENT_AMBULATORY_CARE_PROVIDER_SITE_OTHER): Payer: Medicare HMO | Admitting: Nurse Practitioner

## 2021-11-10 ENCOUNTER — Other Ambulatory Visit: Payer: Self-pay

## 2021-11-10 ENCOUNTER — Encounter: Payer: Self-pay | Admitting: Nurse Practitioner

## 2021-11-10 VITALS — BP 123/62 | HR 92 | Temp 97.8°F | Ht 69.0 in | Wt 189.0 lb

## 2021-11-10 DIAGNOSIS — E1165 Type 2 diabetes mellitus with hyperglycemia: Secondary | ICD-10-CM | POA: Diagnosis not present

## 2021-11-10 DIAGNOSIS — E785 Hyperlipidemia, unspecified: Secondary | ICD-10-CM

## 2021-11-10 DIAGNOSIS — R7309 Other abnormal glucose: Secondary | ICD-10-CM

## 2021-11-10 DIAGNOSIS — I10 Essential (primary) hypertension: Secondary | ICD-10-CM

## 2021-11-10 DIAGNOSIS — E1169 Type 2 diabetes mellitus with other specified complication: Secondary | ICD-10-CM | POA: Diagnosis not present

## 2021-11-10 LAB — POCT GLYCOSYLATED HEMOGLOBIN (HGB A1C)
HbA1c POC (<> result, manual entry): 6.9 % (ref 4.0–5.6)
HbA1c, POC (controlled diabetic range): 6.9 % (ref 0.0–7.0)
HbA1c, POC (prediabetic range): 6.9 % — AB (ref 5.7–6.4)
Hemoglobin A1C: 6.9 % — AB (ref 4.0–5.6)

## 2021-11-10 MED ORDER — ATORVASTATIN CALCIUM 20 MG PO TABS: 20.0000 mg | ORAL_TABLET | Freq: Every day | ORAL | 3 refills | Status: DC

## 2021-11-10 MED ORDER — SITAGLIPTIN PHOSPHATE 25 MG PO TABS: 25.0000 mg | ORAL_TABLET | Freq: Every day | ORAL | 3 refills | Status: DC
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
## 2021-11-17 ENCOUNTER — Telehealth: Payer: Self-pay

## 2021-11-17 ENCOUNTER — Other Ambulatory Visit: Payer: Self-pay

## 2021-11-17 MED ORDER — ACCU-CHEK GUIDE VI STRP: 1.0000 | ORAL_STRIP | Freq: Two times a day (BID) | 3 refills | Status: DC

## 2021-11-17 NOTE — Telephone Encounter (Signed)
Rx refills have been sent to the patients pharmacy.

## 2021-11-17 NOTE — Telephone Encounter (Signed)
Pt called in and asked for the ACCU check stripes tp be refilled   [facility] on [address]

## 2021-12-05 ENCOUNTER — Telehealth: Payer: Self-pay

## 2021-12-05 NOTE — Telephone Encounter (Signed)
This pt is asking for a generic test strips refill due to cant afford the last one's call in. ?

## 2021-12-08 ENCOUNTER — Other Ambulatory Visit: Payer: Self-pay | Admitting: Nurse Practitioner

## 2021-12-08 NOTE — Telephone Encounter (Signed)
[place] said the insurance covers [employer] products ?

## 2021-12-20 ENCOUNTER — Ambulatory Visit: Payer: Medicare Other | Admitting: Physician Assistant

## 2022-01-29 ENCOUNTER — Telehealth: Payer: Self-pay

## 2022-01-29 NOTE — Telephone Encounter (Signed)
Test stripes ?

## 2022-01-30 ENCOUNTER — Other Ambulatory Visit: Payer: Self-pay

## 2022-01-30 DIAGNOSIS — E1165 Type 2 diabetes mellitus with hyperglycemia: Secondary | ICD-10-CM

## 2022-01-30 MED ORDER — ACCU-CHEK GUIDE VI STRP: 1.0000 | ORAL_STRIP | Freq: Two times a day (BID) | 3 refills | Status: DC

## 2022-01-30 NOTE — Telephone Encounter (Signed)
Refills have been sent to he patients pharmacy ?

## 2022-02-09 ENCOUNTER — Ambulatory Visit (INDEPENDENT_AMBULATORY_CARE_PROVIDER_SITE_OTHER): Payer: Medicare Other | Admitting: Nurse Practitioner

## 2022-02-09 ENCOUNTER — Encounter: Payer: Self-pay | Admitting: Nurse Practitioner

## 2022-02-09 ENCOUNTER — Ambulatory Visit: Payer: Medicare HMO | Admitting: Nurse Practitioner

## 2022-02-09 VITALS — BP 119/71 | HR 85 | Temp 97.5°F | Ht 69.0 in | Wt 189.6 lb

## 2022-02-09 DIAGNOSIS — Z0000 Encounter for general adult medical examination without abnormal findings: Secondary | ICD-10-CM

## 2022-02-09 DIAGNOSIS — H9191 Unspecified hearing loss, right ear: Secondary | ICD-10-CM | POA: Diagnosis not present

## 2022-02-09 DIAGNOSIS — E1165 Type 2 diabetes mellitus with hyperglycemia: Secondary | ICD-10-CM | POA: Diagnosis not present

## 2022-02-09 LAB — POCT GLYCOSYLATED HEMOGLOBIN (HGB A1C)
HbA1c POC (<> result, manual entry): 8 % (ref 4.0–5.6)
HbA1c, POC (controlled diabetic range): 8 % — AB (ref 0.0–7.0)
HbA1c, POC (prediabetic range): 8 % — AB (ref 5.7–6.4)
Hemoglobin A1C: 8 % — AB (ref 4.0–5.6)
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
## 2022-02-13 ENCOUNTER — Other Ambulatory Visit: Payer: Self-pay

## 2022-02-13 DIAGNOSIS — E1165 Type 2 diabetes mellitus with hyperglycemia: Secondary | ICD-10-CM

## 2022-02-13 MED ORDER — INSULIN GLARGINE 100 UNITS/ML SOLOSTAR PEN: 15.0000 [IU] | PEN_INJECTOR | Freq: Every day | SUBCUTANEOUS | 0 refills | Status: DC

## 2022-02-16 ENCOUNTER — Other Ambulatory Visit: Payer: Self-pay

## 2022-02-16 DIAGNOSIS — E1165 Type 2 diabetes mellitus with hyperglycemia: Secondary | ICD-10-CM

## 2022-02-16 MED ORDER — INSULIN GLARGINE 100 UNITS/ML SOLOSTAR PEN: 15.0000 [IU] | PEN_INJECTOR | Freq: Every day | SUBCUTANEOUS | 0 refills | Status: DC

## 2022-03-01 ENCOUNTER — Telehealth: Payer: Self-pay

## 2022-03-06 NOTE — Telephone Encounter (Signed)
No additional notes needed  

## 2022-03-07 ENCOUNTER — Other Ambulatory Visit: Payer: Self-pay | Admitting: Nurse Practitioner

## 2022-03-07 DIAGNOSIS — E1165 Type 2 diabetes mellitus with hyperglycemia: Secondary | ICD-10-CM

## 2022-03-07 MED ORDER — INSULIN GLARGINE 100 UNITS/ML SOLOSTAR PEN: 15.0000 [IU] | PEN_INJECTOR | Freq: Every day | SUBCUTANEOUS | 0 refills | Status: DC

## 2022-03-08 ENCOUNTER — Ambulatory Visit: Payer: Medicare Other | Admitting: Audiologist

## 2022-03-15 ENCOUNTER — Ambulatory Visit: Payer: Medicare Other | Admitting: Audiologist

## 2022-04-02 ENCOUNTER — Other Ambulatory Visit: Payer: Self-pay | Admitting: Nurse Practitioner

## 2022-04-02 DIAGNOSIS — E1165 Type 2 diabetes mellitus with hyperglycemia: Secondary | ICD-10-CM

## 2022-07-10 ENCOUNTER — Other Ambulatory Visit: Payer: Self-pay | Admitting: Nurse Practitioner

## 2022-07-10 DIAGNOSIS — E1165 Type 2 diabetes mellitus with hyperglycemia: Secondary | ICD-10-CM

## 2022-07-20 ENCOUNTER — Ambulatory Visit: Payer: Self-pay | Admitting: Nurse Practitioner

## 2022-07-26 ENCOUNTER — Other Ambulatory Visit: Payer: Self-pay | Admitting: Nurse Practitioner

## 2022-07-26 DIAGNOSIS — E1165 Type 2 diabetes mellitus with hyperglycemia: Secondary | ICD-10-CM

## 2022-07-30 ENCOUNTER — Ambulatory Visit (INDEPENDENT_AMBULATORY_CARE_PROVIDER_SITE_OTHER): Payer: Medicare HMO | Admitting: Nurse Practitioner

## 2022-07-30 ENCOUNTER — Encounter: Payer: Self-pay | Admitting: Nurse Practitioner

## 2022-07-30 VITALS — BP 113/63 | HR 86 | Temp 98.5°F | Ht 69.0 in | Wt 196.0 lb

## 2022-07-30 DIAGNOSIS — E1165 Type 2 diabetes mellitus with hyperglycemia: Secondary | ICD-10-CM

## 2022-07-30 DIAGNOSIS — Z1211 Encounter for screening for malignant neoplasm of colon: Secondary | ICD-10-CM | POA: Diagnosis not present

## 2022-07-30 DIAGNOSIS — E1169 Type 2 diabetes mellitus with other specified complication: Secondary | ICD-10-CM | POA: Diagnosis not present

## 2022-07-30 DIAGNOSIS — E785 Hyperlipidemia, unspecified: Secondary | ICD-10-CM

## 2022-07-30 DIAGNOSIS — E663 Overweight: Secondary | ICD-10-CM | POA: Diagnosis not present

## 2022-07-30 DIAGNOSIS — Z23 Encounter for immunization: Secondary | ICD-10-CM | POA: Diagnosis not present

## 2022-07-30 DIAGNOSIS — Z794 Long term (current) use of insulin: Secondary | ICD-10-CM | POA: Diagnosis not present

## 2022-07-30 DIAGNOSIS — Z1231 Encounter for screening mammogram for malignant neoplasm of breast: Secondary | ICD-10-CM | POA: Diagnosis not present

## 2022-07-30 DIAGNOSIS — I739 Peripheral vascular disease, unspecified: Secondary | ICD-10-CM | POA: Diagnosis not present

## 2022-07-30 MED ORDER — GLIPIZIDE 10 MG PO TABS: 10.0000 mg | ORAL_TABLET | Freq: Two times a day (BID) | ORAL | 0 refills | Status: DC

## 2022-07-30 MED ORDER — SITAGLIPTIN PHOSPHATE 25 MG PO TABS: 25.0000 mg | ORAL_TABLET | Freq: Every day | ORAL | 3 refills | Status: DC

## 2022-07-30 MED ORDER — METFORMIN HCL 1000 MG PO TABS: ORAL_TABLET | ORAL | 0 refills | Status: DC

## 2022-07-30 MED ORDER — INSULIN GLARGINE 100 UNITS/ML SOLOSTAR PEN: 15.0000 [IU] | PEN_INJECTOR | Freq: Every day | SUBCUTANEOUS | 0 refills | Status: DC
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
## 2022-07-30 NOTE — Assessment & Plan Note (Addendum)
Lab Results  Component Value Date   HGBA1C 8.0 (A) 02/09/2022   HGBA1C 8.0 02/09/2022   HGBA1C 8.0 (A) 02/09/2022   HGBA1C 8.0 (A) 02/09/2022   Currently on glipizide 10 mg twice daily, Lantus 15 units at bedtime, metformin 1000 mg twice daily, Januvia 25 mg daily. Check A1c, urine ACR ,up-to-date with diabetic eye exam Patient counseled on low-carb modified diet. Continue to monitor blood sugar at home. Follow-up in 4 months Medications were refilled today

## 2022-07-30 NOTE — Assessment & Plan Note (Signed)
Wt Readings from Last 3 Encounters:  07/30/22 196 lb (88.9 kg)  02/09/22 189 lb 9.6 oz (86 kg)  11/10/21 189 lb (85.7 kg)  Patient counseled on low-carb modified diet Encouraged to engage in regular moderate exercises at least 150 minutes weekly

## 2022-07-30 NOTE — Patient Instructions (Addendum)
Please get your Tdap and shingles vaccine at the pharmacy. Flu vaccine today. Schedule an appointment for your labs    It is important that you exercise regularly at least 30 minutes 5 times a week as tolerated  Think about what you will eat, plan ahead. Choose " clean, green, fresh or frozen" over canned, processed or packaged foods which are more sugary, salty and fatty. 70 to 75% of food eaten should be vegetables and fruit. Three meals at set times with snacks allowed between meals, but they must be fruit or vegetables. Aim to eat over a 12 hour period , example 7 am to 7 pm, and STOP after  your last meal of the day. Drink water,generally about 64 ounces per day, no other drink is as healthy. Fruit juice is best enjoyed in a healthy way, by EATING the fruit.  Thanks for choosing [facility] we consider it a privelige to serve you.

## 2022-07-30 NOTE — Assessment & Plan Note (Signed)
Patient educated on CDC recommendation for the influenza vaccine. Verbal consent was obtained from the patient, vaccine administered by nurse, no sign of adverse reactions noted at this time. Patient education on arm soreness and use of tylenol for this patient  was discussed. Patient educated on the signs and symptoms of adverse effect and advise to contact the office if they occur.  

## 2022-07-30 NOTE — Assessment & Plan Note (Signed)
Lab Results  Component Value Date   CHOL 119 08/02/2021   HDL 61 08/02/2021   LDLCALC 48 08/02/2021   TRIG 40 08/02/2021   CHOLHDL 2.0 08/02/2021  LDL goal is less than 70 Currently on atorvastatin 20 mg daily Check fasting lipid panel

## 2022-07-30 NOTE — Assessment & Plan Note (Signed)
No complaints today Currently on atorvastatin 20 mg daily Check lipid panel

## 2022-08-01 ENCOUNTER — Ambulatory Visit: Payer: Medicare Other | Admitting: Physician Assistant

## 2022-09-07 DIAGNOSIS — Z0000 Encounter for general adult medical examination without abnormal findings: Secondary | ICD-10-CM | POA: Diagnosis not present

## 2022-09-07 DIAGNOSIS — M542 Cervicalgia: Secondary | ICD-10-CM | POA: Diagnosis not present

## 2022-09-07 DIAGNOSIS — Z79899 Other long term (current) drug therapy: Secondary | ICD-10-CM | POA: Diagnosis not present

## 2022-09-07 DIAGNOSIS — E119 Type 2 diabetes mellitus without complications: Secondary | ICD-10-CM | POA: Diagnosis not present

## 2022-09-07 DIAGNOSIS — M129 Arthropathy, unspecified: Secondary | ICD-10-CM | POA: Diagnosis not present

## 2022-09-07 DIAGNOSIS — M25511 Pain in right shoulder: Secondary | ICD-10-CM | POA: Diagnosis not present

## 2022-09-07 DIAGNOSIS — Z6827 Body mass index (BMI) 27.0-27.9, adult: Secondary | ICD-10-CM | POA: Diagnosis not present

## 2022-09-07 DIAGNOSIS — M546 Pain in thoracic spine: Secondary | ICD-10-CM | POA: Diagnosis not present

## 2022-09-07 DIAGNOSIS — Z1159 Encounter for screening for other viral diseases: Secondary | ICD-10-CM | POA: Diagnosis not present

## 2022-09-07 DIAGNOSIS — E559 Vitamin D deficiency, unspecified: Secondary | ICD-10-CM | POA: Diagnosis not present

## 2022-09-07 DIAGNOSIS — M545 Low back pain, unspecified: Secondary | ICD-10-CM | POA: Diagnosis not present

## 2022-09-07 DIAGNOSIS — M25512 Pain in left shoulder: Secondary | ICD-10-CM | POA: Diagnosis not present

## 2022-09-14 DIAGNOSIS — M25512 Pain in left shoulder: Secondary | ICD-10-CM | POA: Diagnosis not present

## 2022-09-14 DIAGNOSIS — E119 Type 2 diabetes mellitus without complications: Secondary | ICD-10-CM | POA: Diagnosis not present

## 2022-09-14 DIAGNOSIS — M25511 Pain in right shoulder: Secondary | ICD-10-CM | POA: Diagnosis not present

## 2022-09-14 DIAGNOSIS — Z79899 Other long term (current) drug therapy: Secondary | ICD-10-CM | POA: Diagnosis not present

## 2022-09-14 DIAGNOSIS — M545 Low back pain, unspecified: Secondary | ICD-10-CM | POA: Diagnosis not present

## 2022-09-14 DIAGNOSIS — Z6828 Body mass index (BMI) 28.0-28.9, adult: Secondary | ICD-10-CM | POA: Diagnosis not present

## 2022-09-14 DIAGNOSIS — G8929 Other chronic pain: Secondary | ICD-10-CM | POA: Diagnosis not present

## 2022-09-14 DIAGNOSIS — M542 Cervicalgia: Secondary | ICD-10-CM | POA: Diagnosis not present

## 2022-09-28 ENCOUNTER — Ambulatory Visit: Payer: Medicare HMO

## 2022-10-15 ENCOUNTER — Encounter: Payer: Self-pay | Admitting: Gastroenterology

## 2022-10-15 ENCOUNTER — Other Ambulatory Visit: Payer: Self-pay

## 2022-10-15 DIAGNOSIS — F330 Major depressive disorder, recurrent, mild: Secondary | ICD-10-CM

## 2022-10-15 MED ORDER — CITALOPRAM HYDROBROMIDE 40 MG PO TABS: 40.0000 mg | ORAL_TABLET | Freq: Every day | ORAL | 1 refills | Status: DC

## 2022-10-15 NOTE — Telephone Encounter (Signed)
Caller & Relationship to patient:  MRN #  [id number]   Call Back Number:   Date of Last Office Visit: Visit date not found     Date of Next Office Visit: Visit date not found    Medication(s) to be Refilled: Citalopram  Preferred Pharmacy:   ** Please notify patient to allow 48-72 hours to process** **Let patient know to contact pharmacy at the end of the day to make sure medication is ready. ** **If patient has not been seen in a year or longer, book an appointment **Advise to use MyChart for refill requests OR to contact their pharmacy

## 2022-10-24 ENCOUNTER — Ambulatory Visit (INDEPENDENT_AMBULATORY_CARE_PROVIDER_SITE_OTHER): Payer: Medicare HMO | Admitting: Nurse Practitioner

## 2022-10-24 ENCOUNTER — Encounter: Payer: Self-pay | Admitting: Nurse Practitioner

## 2022-10-24 VITALS — BP 120/80 | HR 88 | Temp 97.5°F | Ht 69.0 in | Wt 189.6 lb

## 2022-10-24 DIAGNOSIS — I10 Essential (primary) hypertension: Secondary | ICD-10-CM

## 2022-10-24 DIAGNOSIS — M9908 Segmental and somatic dysfunction of rib cage: Secondary | ICD-10-CM | POA: Diagnosis not present

## 2022-10-24 DIAGNOSIS — E1165 Type 2 diabetes mellitus with hyperglycemia: Secondary | ICD-10-CM | POA: Diagnosis not present

## 2022-10-24 DIAGNOSIS — Z794 Long term (current) use of insulin: Secondary | ICD-10-CM

## 2022-10-24 LAB — POCT GLYCOSYLATED HEMOGLOBIN (HGB A1C): Hemoglobin A1C: 7.3 % — AB (ref 4.0–5.6)

## 2022-10-24 MED ORDER — SITAGLIPTIN PHOSPHATE 50 MG PO TABS: 50.0000 mg | ORAL_TABLET | Freq: Every day | ORAL | 0 refills | Status: DC

## 2022-10-24 MED ORDER — CYCLOBENZAPRINE HCL 5 MG PO TABS: 5.0000 mg | ORAL_TABLET | Freq: Every evening | ORAL | 1 refills | Status: DC | PRN

## 2022-10-24 MED ORDER — ACCU-CHEK GUIDE W/DEVICE KIT: PACK | 0 refills | Status: AC

## 2022-10-24 MED ORDER — IBUPROFEN 600 MG PO TABS: 600.0000 mg | ORAL_TABLET | Freq: Three times a day (TID) | ORAL | 0 refills | Status: DC | PRN

## 2022-10-24 MED ORDER — ACCU-CHEK SOFTCLIX LANCETS MISC: 6 refills | Status: AC

## 2022-10-24 MED ORDER — ACCU-CHEK GUIDE VI STRP: 1.0000 | ORAL_STRIP | Freq: Two times a day (BID) | 3 refills | Status: AC

## 2022-10-24 NOTE — Assessment & Plan Note (Signed)
BP Readings from Last 3 Encounters:  10/24/22 120/80  07/30/22 113/63  02/09/22 119/71  Chronic medical condition currently well-controlled Not on medications DASH diet advised, engage in regular more.  Exercises at least 150 minutes weekly as tolerated

## 2022-10-24 NOTE — Assessment & Plan Note (Signed)
Lab Results  Component Value Date   HGBA1C 7.3 (A) 10/24/2022  A1c much improved from last reading of 8.0. Currently on glipizide 10 mg twice daily, Lantus 15 units at bedtime, metformin 1000 mg twice daily, Januvia 25 mg daily.  No complaints of hypoglycemia Continue glipizide 10 mg twice daily, Lantus 15 units at bedtime, metformin 1000 mg twice daily starts Januvia 50 mg daily Patient counseled on low-carb modified diet Checking urine microalbumin, CMP CBC today Fasting lipid panel at next visit Up-to-date with diabetic eye exam

## 2022-10-24 NOTE — Patient Instructions (Signed)
1. Type 2 diabetes mellitus with hyperglycemia, with long-term current use of insulin (HCC)  - Microalbumin/Creatinine Ratio, Urine - POCT glycosylated hemoglobin (Hb A1C) - sitaGLIPtin (JANUVIA) 50 MG tablet; Take 1 tablet (50 mg total) by mouth daily.  Dispense: 90 tablet; Refill: 0 - CMP14+EGFR - CBC with Differential  2. Rib cage dysfunction  - ibuprofen (ADVIL) 600 MG tablet; Take 1 tablet (600 mg total) by mouth every 8 (eight) hours as needed.  Dispense: 30 tablet; Refill: 0 - cyclobenzaprine (FLEXERIL) 5 MG tablet; Take 1 tablet (5 mg total) by mouth at bedtime as needed for muscle spasms.  Dispense: 30 tablet;     It is important that you exercise regularly at least 30 minutes 5 times a week as tolerated  Think about what you will eat, plan ahead. Choose " clean, green, fresh or frozen" over canned, processed or packaged foods which are more sugary, salty and fatty. 70 to 75% of food eaten should be vegetables and fruit. Three meals at set times with snacks allowed between meals, but they must be fruit or vegetables. Aim to eat over a 12 hour period , example 7 am to 7 pm, and STOP after  your last meal of the day. Drink water,generally about 64 ounces per day, no other drink is as healthy. Fruit juice is best enjoyed in a healthy way, by EATING the fruit.  Thanks for choosing [facility] we consider it a privelige to serve you.

## 2022-10-24 NOTE — Progress Notes (Signed)
Acute Office Visit  Subjective:     Patient ID: Jennifer Rhodes, female    DOB: October 05, 1960, 62 y.o.   MRN: [id number]  Chief Complaint  Patient presents with   Diabetes    Pain    HPI Ms. Jennifer Rhodes with past medical history of uncontrolled type 2 diabetes, hypertension, PAD, hyperlipidemia, scleroderma presents with complaints of left-sided abdominal pain on and off for the past few weeks .  She started a new job about 3 months ago, states that she does a lot of bending lifting and packing things at work.  She has been taking Aleve as needed states that Aleve helps some.  She also reports intermittent acid reflux relieved with Tums as needed.  She denies nausea, vomiting, constipation, bloody stool, unintentional weight loss her pain is worse with movements her pain is not associated with eating, has upcoming colonoscopy in February     Review of Systems  Constitutional:  Negative for chills, diaphoresis, fever, malaise/fatigue and weight loss.  Respiratory:  Negative for cough, hemoptysis, sputum production, shortness of breath and wheezing.   Cardiovascular:  Negative for chest pain, palpitations, orthopnea, claudication and leg swelling.  Gastrointestinal:  Positive for abdominal pain. Negative for blood in stool, constipation, diarrhea, nausea and vomiting.  Genitourinary: Negative.  Negative for dysuria, flank pain, frequency, hematuria and urgency.  Musculoskeletal:  Positive for myalgias.  Neurological:  Negative for dizziness, tingling, tremors, sensory change and headaches.  Psychiatric/Behavioral: Negative.  Negative for depression, hallucinations, substance abuse and suicidal ideas. The patient is not nervous/anxious.         Objective:    BP 120/80   Pulse 88   Temp (!) 97.5 F (36.4 C)   Ht 5\' 9"  (1.753 m)   Wt 189 lb 9.6 oz (86 kg)   SpO2 100%   BMI 28.00 kg/m    Physical Exam Constitutional:      General: She is not in acute distress.    Appearance: Normal  appearance. She is not ill-appearing, toxic-appearing or diaphoretic.  Eyes:     General: No scleral icterus.       Right eye: No discharge.        Left eye: No discharge.  Cardiovascular:     Rate and Rhythm: Normal rate and regular rhythm.     Pulses: Normal pulses.     Heart sounds: Normal heart sounds. No murmur heard.    No friction rub. No gallop.  Pulmonary:     Effort: Pulmonary effort is normal. No respiratory distress.     Breath sounds: Normal breath sounds. No stridor. No wheezing, rhonchi or rales.  Chest:     Chest wall: No tenderness.     Comments: Left lower rib cage area tender on palpation, no redness swelling noted. Abdominal:     General: There is no distension.     Palpations: Abdomen is soft. There is no mass.     Tenderness: There is no right CVA tenderness, guarding or rebound.  Skin:    General: Skin is warm and dry.     Coloration: Skin is not jaundiced or pale.     Findings: No bruising, erythema or lesion.  Neurological:     Mental Status: She is alert and oriented to person, place, and time.     Cranial Nerves: No cranial nerve deficit.     Motor: No weakness.     Gait: Gait normal.  Psychiatric:        Mood and  Affect: Mood normal.        Behavior: Behavior normal.        Thought Content: Thought content normal.        Judgment: Judgment normal.     Results for orders placed or performed in visit on 10/24/22  POCT glycosylated hemoglobin (Hb A1C)  Result Value Ref Range   Hemoglobin A1C 7.3 (A) 4.0 - 5.6 %   HbA1c POC (<> result, manual entry)     HbA1c, POC (prediabetic range)     HbA1c, POC (controlled diabetic range)          Assessment & Plan:   Problem List Items Addressed This Visit       Cardiovascular and Mediastinum   Essential hypertension, benign    BP Readings from Last 3 Encounters:  10/24/22 120/80  07/30/22 113/63  02/09/22 119/71  Chronic medical condition currently well-controlled Not on medications DASH diet  advised, engage in regular more.  Exercises at least 150 minutes weekly as tolerated        Endocrine   Type 2 diabetes mellitus with hyperglycemia, without long-term current use of insulin (HCC)    Lab Results  Component Value Date   HGBA1C 7.3 (A) 10/24/2022  A1c much improved from last reading of 8.0. Currently on glipizide 10 mg twice daily, Lantus 15 units at bedtime, metformin 1000 mg twice daily, Januvia 25 mg daily.  No complaints of hypoglycemia Continue glipizide 10 mg twice daily, Lantus 15 units at bedtime, metformin 1000 mg twice daily starts Januvia 50 mg daily Patient counseled on low-carb modified diet Checking urine microalbumin, CMP CBC today Fasting lipid panel at next visit Up-to-date with diabetic eye exam      Relevant Medications   sitaGLIPtin (JANUVIA) 50 MG tablet   glucose blood (ACCU-CHEK GUIDE) test strip   Blood Glucose Monitoring Suppl (ACCU-CHEK GUIDE) w/Device KIT     Other   Rib cage dysfunction     - ibuprofen (ADVIL) 600 MG tablet; Take 1 tablet (600 mg total) by mouth every 8 (eight) hours as needed.  Dispense: 30 tablet; Refill: 0 - cyclobenzaprine (FLEXERIL) 5 MG tablet; Take 1 tablet (5 mg total) by mouth at bedtime as needed for muscle spasms.  Dispense: 30 tablet; Refill: 1       Relevant Medications   ibuprofen (ADVIL) 600 MG tablet   cyclobenzaprine (FLEXERIL) 5 MG tablet   Other Visit Diagnoses     Type 2 diabetes mellitus with hyperglycemia, with long-term current use of insulin (HCC)    -  Primary   Relevant Medications   sitaGLIPtin (JANUVIA) 50 MG tablet   Other Relevant Orders   Microalbumin/Creatinine Ratio, Urine   POCT glycosylated hemoglobin (Hb A1C) (Completed)   CMP14+EGFR   CBC with Differential       Meds ordered this encounter  Medications   sitaGLIPtin (JANUVIA) 50 MG tablet    Sig: Take 1 tablet (50 mg total) by mouth daily.    Dispense:  90 tablet    Refill:  0   ibuprofen (ADVIL) 600 MG tablet     Sig: Take 1 tablet (600 mg total) by mouth every 8 (eight) hours as needed.    Dispense:  30 tablet    Refill:  0   cyclobenzaprine (FLEXERIL) 5 MG tablet    Sig: Take 1 tablet (5 mg total) by mouth at bedtime as needed for muscle spasms.    Dispense:  30 tablet    Refill:  1  Accu-Chek Softclix Lancets lancets    Sig: Use 1-4 times daily as needed to check blood sugar readings    Dispense:  200 each    Refill:  6   glucose blood (ACCU-CHEK GUIDE) test strip    Sig: 1 each by Other route in the morning and at bedtime. Use 1-4 times daily as needed.    Dispense:  300 each    Refill:  3   Blood Glucose Monitoring Suppl (ACCU-CHEK GUIDE) w/Device KIT    Sig: Use 1-4 times daily as needed to check blood sugar reading    Dispense:  1 kit    Refill:  0    No follow-ups on file.  Renee Rival, FNP

## 2022-10-24 NOTE — Assessment & Plan Note (Signed)
-   ibuprofen (ADVIL) 600 MG tablet; Take 1 tablet (600 mg total) by mouth every 8 (eight) hours as needed.  Dispense: 30 tablet; Refill: 0 - cyclobenzaprine (FLEXERIL) 5 MG tablet; Take 1 tablet (5 mg total) by mouth at bedtime as needed for muscle spasms.  Dispense: 30 tablet; Refill: 1

## 2022-10-25 LAB — CMP14+EGFR
ALT: 23 IU/L (ref 0–32)
AST: 18 IU/L (ref 0–40)
Albumin/Globulin Ratio: 1.9 (ref 1.2–2.2)
Albumin: 4.1 g/dL (ref 3.9–4.9)
Alkaline Phosphatase: 61 IU/L (ref 44–121)
BUN/Creatinine Ratio: 21 (ref 12–28)
BUN: 15 mg/dL (ref 8–27)
Bilirubin Total: 0.4 mg/dL (ref 0.0–1.2)
CO2: 28 mmol/L (ref 20–29)
Calcium: 9.3 mg/dL (ref 8.7–10.3)
Chloride: 100 mmol/L (ref 96–106)
Creatinine, Ser: 0.7 mg/dL (ref 0.57–1.00)
Globulin, Total: 2.2 g/dL (ref 1.5–4.5)
Glucose: 249 mg/dL — ABNORMAL HIGH (ref 70–99)
Potassium: 4.5 mmol/L (ref 3.5–5.2)
Sodium: 141 mmol/L (ref 134–144)
Total Protein: 6.3 g/dL (ref 6.0–8.5)
eGFR: 97 mL/min/{1.73_m2} (ref >59)

## 2022-10-25 LAB — CBC WITH DIFFERENTIAL/PLATELET
Basophils Absolute: 0 10*3/uL (ref 0.0–0.2)
Basos: 1 %
EOS (ABSOLUTE): 0.1 10*3/uL (ref 0.0–0.4)
Eos: 2 %
Hematocrit: 36.8 % (ref 34.0–46.6)
Hemoglobin: 11.4 g/dL (ref 11.1–15.9)
Immature Grans (Abs): 0 10*3/uL (ref 0.0–0.1)
Immature Granulocytes: 0 %
Lymphocytes Absolute: 2.6 10*3/uL (ref 0.7–3.1)
Lymphs: 41 %
MCH: 27.7 pg (ref 26.6–33.0)
MCHC: 31 g/dL — ABNORMAL LOW (ref 31.5–35.7)
MCV: 89 fL (ref 79–97)
Monocytes Absolute: 0.5 10*3/uL (ref 0.1–0.9)
Monocytes: 9 %
Neutrophils Absolute: 3.1 10*3/uL (ref 1.4–7.0)
Neutrophils: 47 %
Platelets: 299 10*3/uL (ref 150–450)
RBC: 4.12 x10E6/uL (ref 3.77–5.28)
RDW: 12.3 % (ref 11.7–15.4)
WBC: 6.3 10*3/uL (ref 3.4–10.8)

## 2022-10-26 LAB — MICROALBUMIN / CREATININE URINE RATIO
Creatinine, Urine: 167.4 mg/dL
Microalb/Creat Ratio: 6 mg/g creat (ref 0–29)
Microalbumin, Urine: 10.5 ug/mL

## 2022-10-26 NOTE — Progress Notes (Signed)
Stable labs except for high blood sugar, continue current medications as ordered.

## 2022-10-26 NOTE — Progress Notes (Signed)
Normal labs, continue current medications

## 2022-10-30 ENCOUNTER — Telehealth: Payer: Self-pay | Admitting: Nurse Practitioner

## 2022-10-30 NOTE — Telephone Encounter (Signed)
Caller & Relationship to patient:  MRN #  [id number]   Call Back Number:   Date of Last Office Visit: 10/24/2022     Date of Next Office Visit: 11/28/2022    Medication(s) to be Refilled: lisinopril metformin  Preferred Pharmacy:   ** Please notify patient to allow 48-72 hours to process** **Let patient know to contact pharmacy at the end of the day to make sure medication is ready. ** **If patient has not been seen in a year or longer, book an appointment **Advise to use MyChart for refill requests OR to contact their pharmacy

## 2022-10-31 ENCOUNTER — Other Ambulatory Visit: Payer: Self-pay

## 2022-10-31 DIAGNOSIS — E1165 Type 2 diabetes mellitus with hyperglycemia: Secondary | ICD-10-CM

## 2022-10-31 MED ORDER — METFORMIN HCL 1000 MG PO TABS: ORAL_TABLET | ORAL | 0 refills | Status: DC

## 2022-11-01 ENCOUNTER — Other Ambulatory Visit: Payer: Self-pay

## 2022-11-01 DIAGNOSIS — Z794 Long term (current) use of insulin: Secondary | ICD-10-CM

## 2022-11-01 MED ORDER — GLIPIZIDE 10 MG PO TABS: 10.0000 mg | ORAL_TABLET | Freq: Two times a day (BID) | ORAL | 0 refills | Status: DC

## 2022-11-01 NOTE — Telephone Encounter (Signed)
I do not see the lisinopril in pt chart. Please advise [facility]

## 2022-11-05 ENCOUNTER — Encounter: Payer: Self-pay | Admitting: Gastroenterology

## 2022-11-05 ENCOUNTER — Ambulatory Visit (INDEPENDENT_AMBULATORY_CARE_PROVIDER_SITE_OTHER): Payer: Medicare HMO | Admitting: Gastroenterology

## 2022-11-05 VITALS — BP 116/58 | HR 89 | Ht 69.0 in | Wt 189.0 lb

## 2022-11-05 DIAGNOSIS — E1165 Type 2 diabetes mellitus with hyperglycemia: Secondary | ICD-10-CM | POA: Diagnosis not present

## 2022-11-05 DIAGNOSIS — Z86010 Personal history of colonic polyps: Secondary | ICD-10-CM | POA: Diagnosis not present

## 2022-11-05 MED ORDER — NA SULFATE-K SULFATE-MG SULF 17.5-3.13-1.6 GM/177ML PO SOLN: 1.0000 | Freq: Once | ORAL | 0 refills | Status: AC

## 2022-11-05 NOTE — Progress Notes (Signed)
Agree with assessment and plan as outlined.  

## 2022-11-05 NOTE — Patient Instructions (Signed)
You have been scheduled for a colonoscopy. Please follow written instructions given to you at your visit today.  Please pick up your prep supplies at the pharmacy within the next 1-3 days. If you use inhalers (even only as needed), please bring them with you on the day of your procedure.  _______________________________________________________  If your blood pressure at your visit was 140/90 or greater, please contact your primary care physician to follow up on this.  _______________________________________________________  If you are age 64 or older, your body mass index should be between 23-30. Your Body mass index is 27.91 kg/m. If this is out of the aforementioned range listed, please consider follow up with your Primary Care Provider.  If you are age 60 or younger, your body mass index should be between 19-25. Your Body mass index is 27.91 kg/m. If this is out of the aformentioned range listed, please consider follow up with your Primary Care Provider.   ________________________________________________________  The [facility] providers would like to encourage you to use [facility] to communicate with providers for non-urgent requests or questions.  Due to long hold times on the telephone, sending your provider a message by [facility] may be a faster and more efficient way to get a response.  Please allow 48 business hours for a response.  Please remember that this is for non-urgent requests.  _______________________________________________________  Thank you for trusting me with your gastrointestinal care!

## 2022-11-05 NOTE — Progress Notes (Signed)
11/05/2022 Jennifer Rhodes [id number] Sep 20, 1961   HISTORY OF PRESENT ILLNESS: This is a pleasant 62 year old female who is new to our office.  She is here today to discuss a colonoscopy.  She says that she has had probably 3 colonoscopies in the past all in [place], [place].  She says that the last one was over 5 years ago.  She reports a history of colon polyps.  Says that she believes that these were all performed at [facility] in [place].  She says that she has issues with occasional diarrhea and sometimes hears a lot of sounds in her abdomen.  Says she has been taking a women's probiotic and that seems to help.  She denies any rectal bleeding.   Past Medical History:  Diagnosis Date   Anxiety    Cataract    Depression    Diabetes mellitus without complication ([place])    History of colonic polyps    Scleroderma ([place])    Past Surgical History:  Procedure Laterality Date   BREAST BIOPSY Left    Patient states btw 2016 and 2017   CATARACT EXTRACTION Left 12/2020   CHOLECYSTECTOMY     colonoscopy  2017   + polyps, repeat in 5 years    reports that she has quit smoking. She has never used smokeless tobacco. She reports that she does not drink alcohol and does not use drugs. family history includes Cancer in her father; Diabetes in her daughter and mother; Mental illness in her mother. Allergies  Allergen Reactions   Aspirin    Jardiance [Empagliflozin] Other (See Comments)    abd pain and dry mouth      Outpatient Encounter Medications as of 11/05/2022  Medication Sig   Accu-Chek Softclix Lancets lancets Use 1-4 times daily as needed to check blood sugar readings   atorvastatin (LIPITOR) 20 MG tablet Take 1 tablet (20 mg total) by mouth daily.   Blood Glucose Monitoring Suppl (ACCU-CHEK GUIDE) w/Device KIT Use 1-4 times daily as needed to check blood sugar reading   citalopram (CELEXA) 40 MG tablet Take 1 tablet (40 mg total) by mouth daily.   cyclobenzaprine  (FLEXERIL) 5 MG tablet Take 1 tablet (5 mg total) by mouth at bedtime as needed for muscle spasms.   glipiZIDE (GLUCOTROL) 10 MG tablet Take 1 tablet (10 mg total) by mouth 2 (two) times daily before a meal.   glucose blood (ACCU-CHEK GUIDE) test strip 1 each by Other route in the morning and at bedtime. Use 1-4 times daily as needed.   ibuprofen (ADVIL) 600 MG tablet Take 1 tablet (600 mg total) by mouth every 8 (eight) hours as needed.   metFORMIN (GLUCOPHAGE) 1000 MG tablet TAKE 1 TABLET BY MOUTH TWICE DAILY WITH MEALS   Multiple Vitamin (MULTIVITAMIN) tablet Take 1 tablet by mouth daily.   sitaGLIPtin (JANUVIA) 50 MG tablet Take 1 tablet (50 mg total) by mouth daily.   traMADol (ULTRAM) 50 MG tablet Take 50 mg by mouth 4 (four) times daily as needed.   insulin glargine (LANTUS) 100 unit/mL SOPN Inject 15 Units into the skin at bedtime.   No facility-administered encounter medications on file as of 11/05/2022.     REVIEW OF SYSTEMS  : All other systems reviewed and negative except where noted in the History of Present Illness.   PHYSICAL EXAM: BP (!) 116/58   Pulse 89   Ht 5' 9"$  (1.753 m)   Wt 189 lb (85.7 kg)   SpO2 99%  BMI 27.91 kg/m  General: Well developed AA female in no acute distress Head: Normocephalic and atraumatic Eyes:  Sclerae anicteric, conjunctiva pink. Ears: Normal auditory acuity Lungs: Clear throughout to auscultation; no W/R/R. Heart: Regular rate and rhythm; no M/R/G. Abdomen: Soft, non-distended.  BS present.  Non-tender.  Scar from previous cholecystectomy noted. Rectal:  Will be done at the time of colonoscopy. Musculoskeletal: Symmetrical with no gross deformities  Skin: No lesions on visible extremities Extremities: No edema  Neurological: Alert oriented x 4, grossly non-focal Psychological:  Alert and cooperative. Normal mood and affect  ASSESSMENT AND PLAN: *History of colon polyps: Reports history of polyps with last colonoscopy over 5 years  ago.  Previous colonoscopies were performed and [employer], [place].  Will have her sign to try to obtain previous colonoscopy records, but will tentatively schedule with Dr. Havery Moros.  The risks, benefits, and alternatives to colonoscopy were discussed with the patient and she consents to proceed.  *DM:  Insulin will be adjusted prior to endoscopic procedure per protocol. Will resume normal dosing after procedure.   **Addendum:  Received previous colonoscopy records.  She had a colonoscopy in February 2011 with 2 diminutive polyps removed.  These were adenomatous colon polyps.  Records sent for scanning.   CC:  Teena Dunk, NP

## 2022-11-06 IMAGING — MG MM DIGITAL SCREENING BILAT W/ TOMO AND CAD
8 series · 8 of 24 positions shown · non-contrast
Comparison: Previous exams.

CLINICAL DATA: Screening.

EXAM:
DIGITAL SCREENING BILATERAL MAMMOGRAM WITH TOMOSYNTHESIS AND CAD
TECHNIQUE: Bilateral screening digital craniocaudal and mediolateral oblique
mammograms were obtained. Bilateral screening digital breast
tomosynthesis was performed. The images were evaluated with
computer-aided detection.

[R MLO synth-2D]
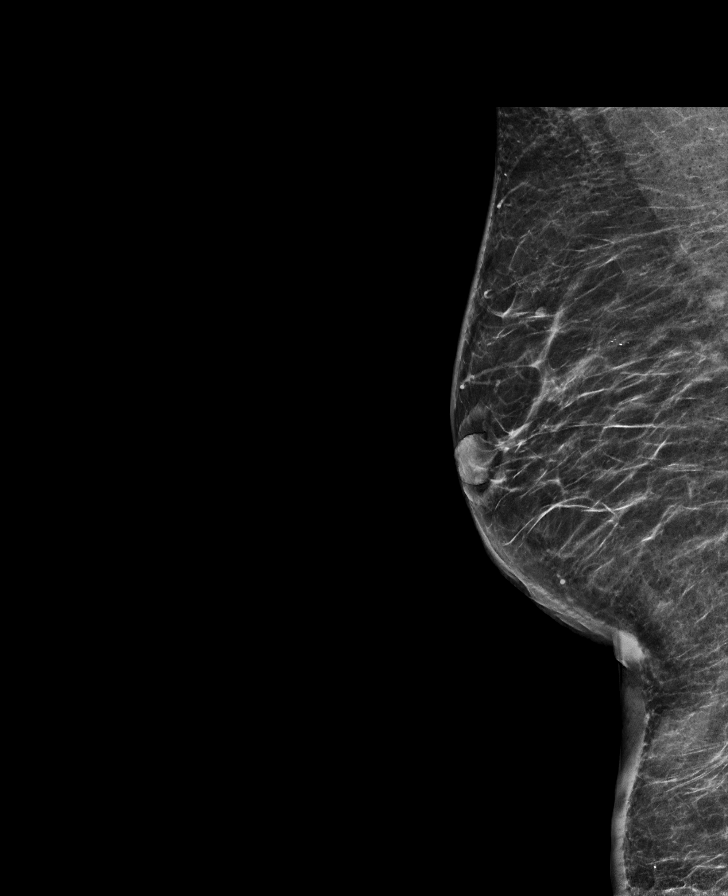

[L CC synth-2D]
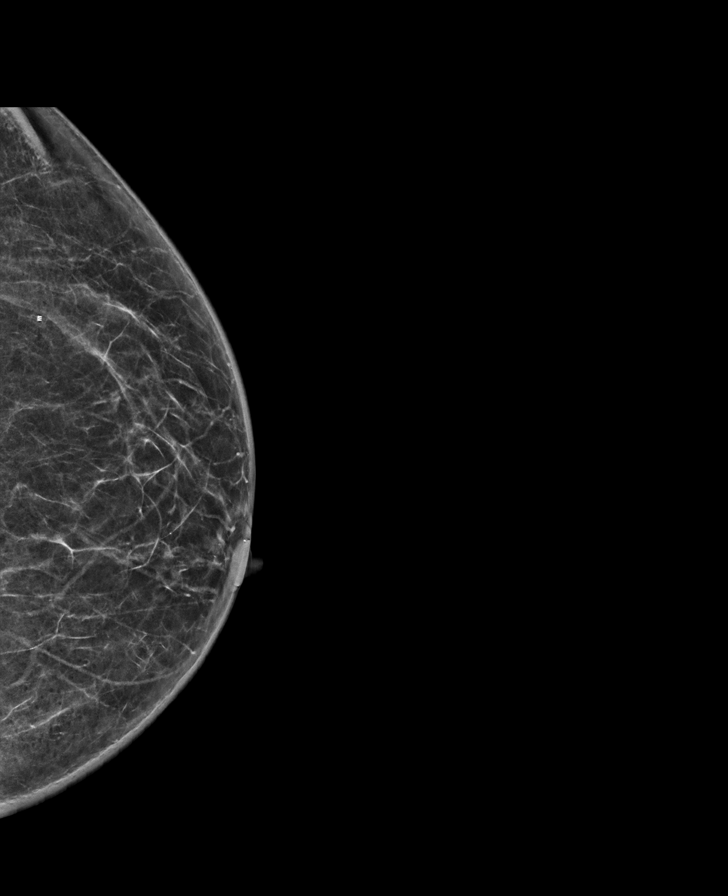

[L MLO synth-2D]
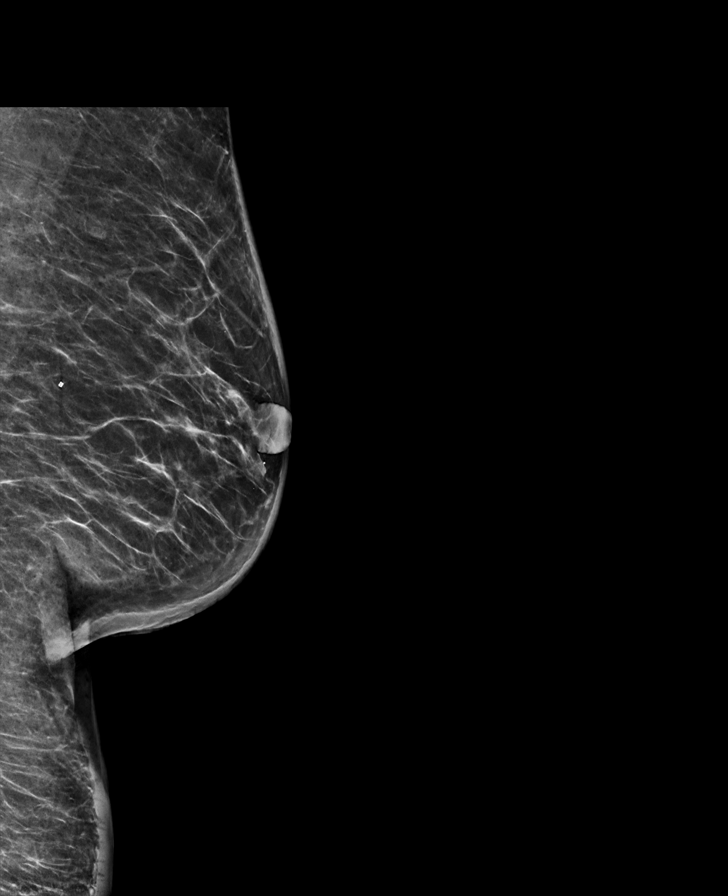

[R CC synth-2D]
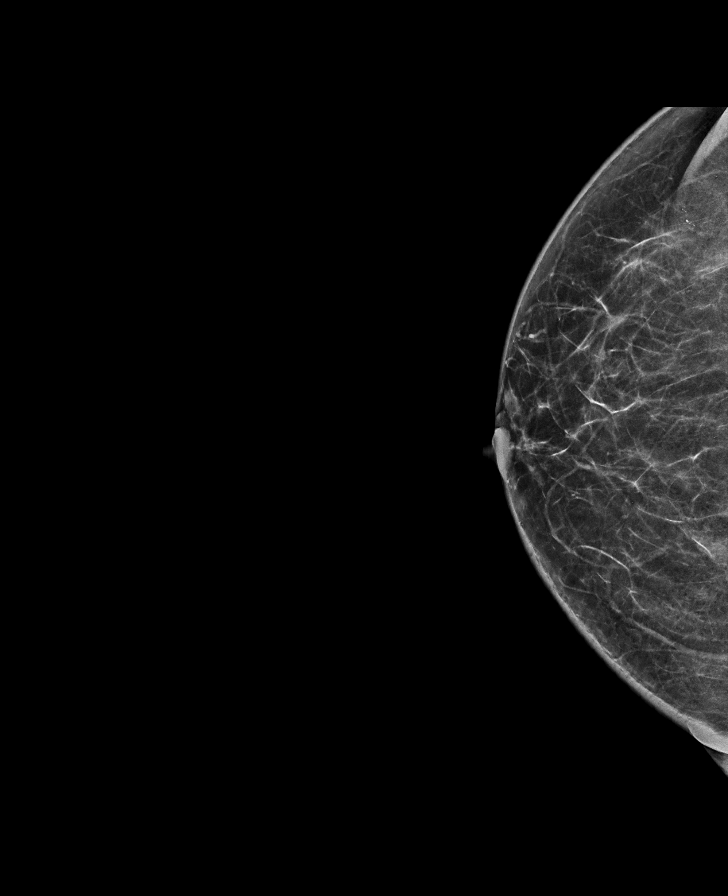

[L MLO tomo · tomo slice 36/71.0]
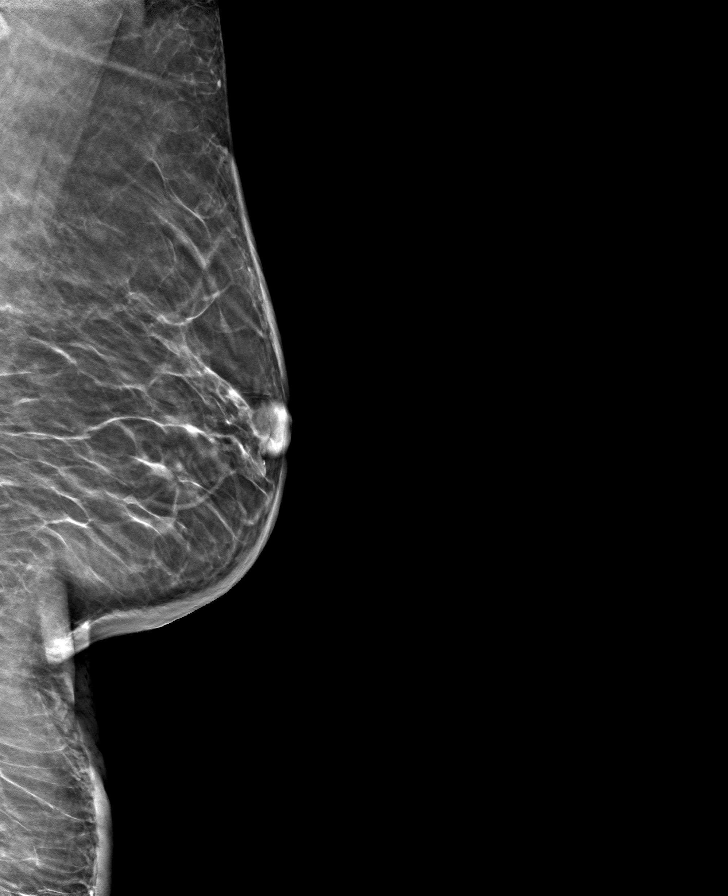

[R MLO tomo · tomo slice 36/71.0]
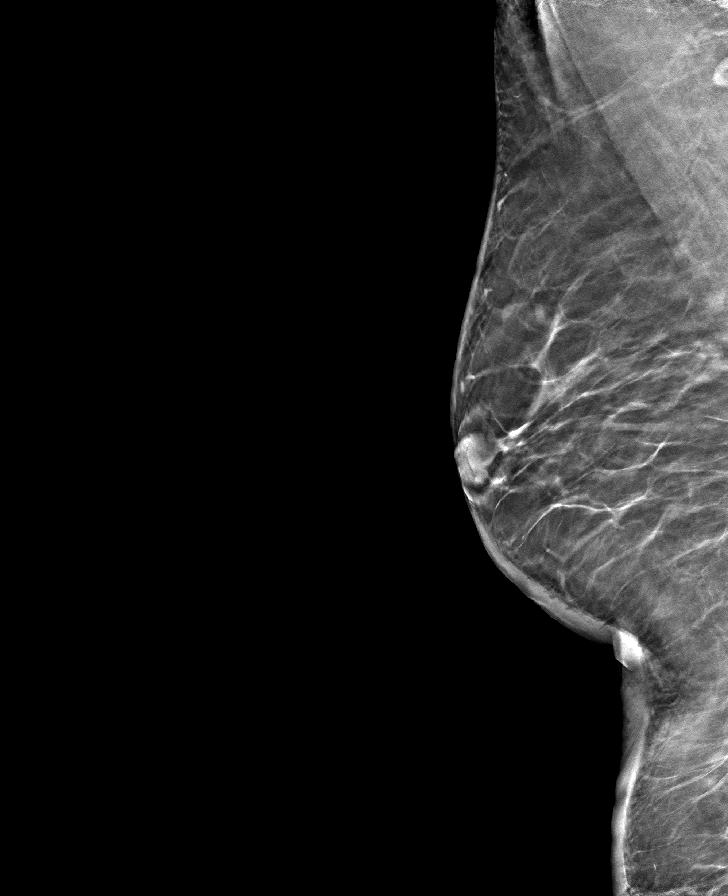

[R CC tomo · tomo slice 31/61.0]
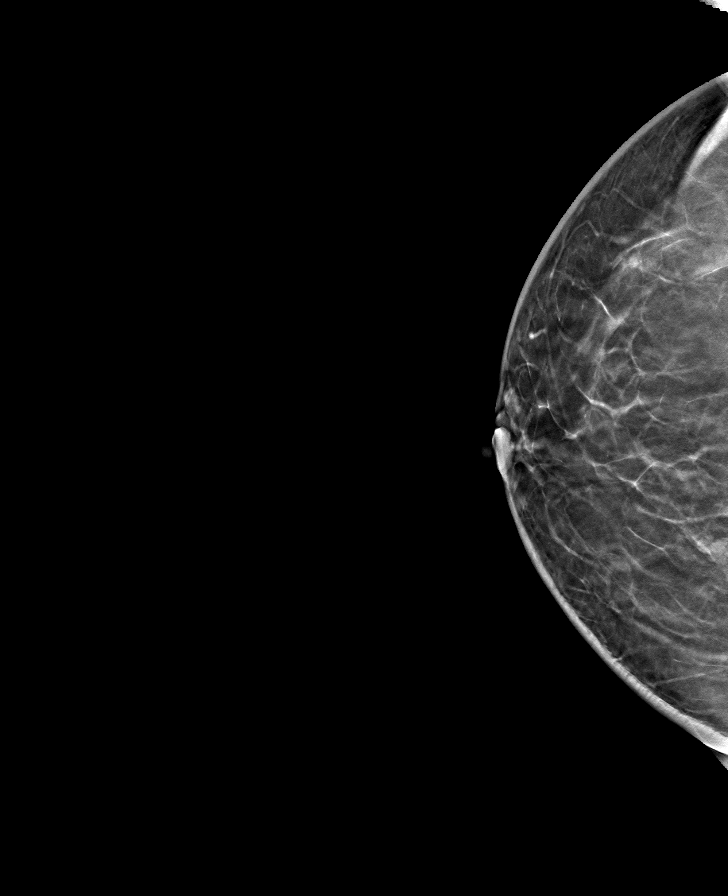

[L CC tomo · tomo slice 31/61.0]
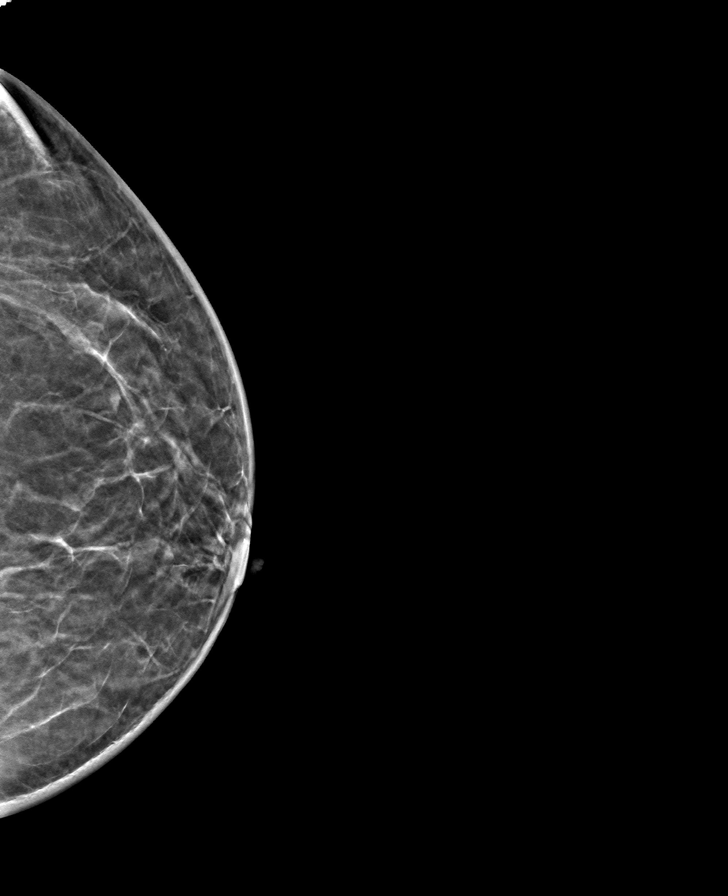

[8 of 24 positions shown; findings below may reference images not displayed]

ACR Breast Density Category b: There are scattered areas of
fibroglandular density.
FINDINGS: In the right breast, calcifications warrant further evaluation with
magnified views. In the left breast, no findings suspicious for
malignancy.
IMPRESSION: Further evaluation is suggested for calcifications in the right
breast.

RECOMMENDATION:
Diagnostic mammogram of the right breast. (Code:77-C-KK2)

The patient will be contacted regarding the findings, and additional
imaging will be scheduled.

BI-RADS CATEGORY  0: Incomplete. Need additional imaging evaluation
and/or prior mammograms for comparison.

## 2022-11-07 ENCOUNTER — Other Ambulatory Visit: Payer: Self-pay

## 2022-11-07 DIAGNOSIS — E1165 Type 2 diabetes mellitus with hyperglycemia: Secondary | ICD-10-CM

## 2022-11-07 MED ORDER — GLIPIZIDE 10 MG PO TABS: 10.0000 mg | ORAL_TABLET | Freq: Two times a day (BID) | ORAL | 0 refills | Status: DC

## 2022-11-13 NOTE — Progress Notes (Signed)
Letter was sent to pt home address with lab results.

## 2022-11-28 ENCOUNTER — Ambulatory Visit: Payer: Self-pay | Admitting: Nurse Practitioner

## 2022-12-05 ENCOUNTER — Other Ambulatory Visit: Payer: Self-pay | Admitting: Nurse Practitioner

## 2022-12-05 ENCOUNTER — Telehealth: Payer: Self-pay | Admitting: Nurse Practitioner

## 2022-12-05 MED ORDER — ATORVASTATIN CALCIUM 20 MG PO TABS: 20.0000 mg | ORAL_TABLET | Freq: Every day | ORAL | 0 refills | Status: DC

## 2022-12-05 NOTE — Telephone Encounter (Signed)
Caller & Relationship to patient:  MRN #  [id number]   Call Back Number:   Date of Last Office Visit: 11/07/2022     Date of Next Office Visit: Visit date not found    Medication(s) to be Refilled: atorvastatin  Preferred Pharmacy:   ** Please notify patient to allow 48-72 hours to process** **Let patient know to contact pharmacy at the end of the day to make sure medication is ready. ** **If patient has not been seen in a year or longer, book an appointment **Advise to use MyChart for refill requests OR to contact their pharmacy

## 2022-12-26 ENCOUNTER — Encounter: Payer: Self-pay | Admitting: Gastroenterology

## 2023-01-04 ENCOUNTER — Encounter: Payer: Self-pay | Admitting: Gastroenterology

## 2023-01-04 ENCOUNTER — Ambulatory Visit (AMBULATORY_SURGERY_CENTER): Payer: Medicare HMO | Admitting: Gastroenterology

## 2023-01-04 VITALS — BP 128/68 | HR 80 | Temp 99.1°F | Resp 16 | Ht 69.0 in | Wt 189.0 lb

## 2023-01-04 DIAGNOSIS — D122 Benign neoplasm of ascending colon: Secondary | ICD-10-CM

## 2023-01-04 DIAGNOSIS — D123 Benign neoplasm of transverse colon: Secondary | ICD-10-CM

## 2023-01-04 DIAGNOSIS — Z09 Encounter for follow-up examination after completed treatment for conditions other than malignant neoplasm: Secondary | ICD-10-CM | POA: Diagnosis not present

## 2023-01-04 DIAGNOSIS — Z86010 Personal history of colonic polyps: Secondary | ICD-10-CM | POA: Diagnosis not present

## 2023-01-04 MED ORDER — SODIUM CHLORIDE 0.9 % IV SOLN: 500.0000 mL | INTRAVENOUS | Status: DC

## 2023-01-04 NOTE — Progress Notes (Signed)
Called to room to assist during endoscopic procedure.  Patient ID and intended procedure confirmed with present staff. Received instructions for my participation in the procedure from the performing physician.  
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
## 2023-01-04 NOTE — Op Note (Addendum)
[facility] Patient Name: Jennifer Rhodes Procedure Date: 01/04/2023 1:57 PM MRN: [id number] Endoscopist: Viviann Spare P. Adela Lank , MD, [id number] Age: 62 Referring MD:  Date of Birth: 04/17/1961 Gender: Female Account #: [id number] Procedure:                Colonoscopy Indications:              High risk colon cancer surveillance: Personal                            history of colonic polyps - adenomas removed 2011 Medicines:                Monitored Anesthesia Care Procedure:                Pre-Anesthesia Assessment:                           - Prior to the procedure, a History and Physical                            was performed, and patient medications and                            allergies were reviewed. The patient's tolerance of                            previous anesthesia was also reviewed. The risks                            and benefits of the procedure and the sedation                            options and risks were discussed with the patient.                            All questions were answered, and informed consent                            was obtained. Prior Anticoagulants: The patient has                            taken no anticoagulant or antiplatelet agents. ASA                            Grade Assessment: II - A patient with mild systemic                            disease. After reviewing the risks and benefits,                            the patient was deemed in satisfactory condition to                            undergo the procedure.  After obtaining informed consent, the colonoscope                            was passed under direct vision. Throughout the                            procedure, the patient's blood pressure, pulse, and                            oxygen saturations were monitored continuously. The                            CF [id number] #[id number] was introduced through the anus                            and  advanced to the the cecum, identified by                            appendiceal orifice and ileocecal valve. The                            colonoscopy was performed without difficulty. The                            patient tolerated the procedure well. The quality                            of the bowel preparation was good. The ileocecal                            valve, appendiceal orifice, and rectum were                            photographed. Scope In: 2:10:42 PM Scope Out: 2:31:26 PM Scope Withdrawal Time: 0 hours 16 minutes 24 seconds  Total Procedure Duration: 0 hours 20 minutes 44 seconds  Findings:                 The perianal and digital rectal examinations were                            normal.                           Five flat polyps were found in the ascending colon.                            The polyps were 2 to 4 mm in size. These polyps                            were removed with a cold snare. Resection and                            retrieval were complete.  Two sessile polyps were found in the transverse                            colon. The polyps were 3 to 4 mm in size. These                            polyps were removed with a cold snare. Resection                            and retrieval were complete.                           Anal papilla(e) were hypertrophied.                           Internal hemorrhoids were found during                            retroflexion. The hemorrhoids were small.                           The exam was otherwise without abnormality. Complications:            No immediate complications. Estimated blood loss:                            Minimal. Estimated Blood Loss:     Estimated blood loss was minimal. Impression:               - Five 2 to 4 mm polyps in the ascending colon,                            removed with a cold snare. Resected and retrieved.                           - Two 3 to 4 mm polyps  in the transverse colon,                            removed with a cold snare. Resected and retrieved.                           - Anal papilla(e) were hypertrophied.                           - Internal hemorrhoids.                           - The examination was otherwise normal.                           - The GI Genius (intelligent endoscopy module),                            computer-aided polyp detection system powered by AI  was utilized to detect colorectal polyps through                            enhanced visualization during colonoscopy. Recommendation:           - Patient has a contact number available for                            emergencies. The signs and symptoms of potential                            delayed complications were discussed with the                            patient. Return to normal activities tomorrow.                            Written discharge instructions were provided to the                            patient.                           - Resume previous diet.                           - Continue present medications.                           - Await pathology results. Viviann Spare P. Dorcas Melito, MD 01/04/2023 2:37:53 PM This report has been signed electronically.

## 2023-01-04 NOTE — Progress Notes (Signed)
Sedate, gd SR, tolerated procedure well, VSS, report to RN 

## 2023-01-04 NOTE — Progress Notes (Signed)
Pt's states no medical or surgical changes since previsit or office visit. 

## 2023-01-04 NOTE — Patient Instructions (Signed)
Resume previous diet Continue present medications Await pathology results Handouts/information given for polyps and hemorrhoids  YOU HAD AN ENDOSCOPIC PROCEDURE TODAY AT THE [facility]:   Refer to the procedure report that was given to you for any specific questions about what was found during the examination.  If the procedure report does not answer your questions, please call your gastroenterologist to clarify.  If you requested that your care partner not be given the details of your procedure findings, then the procedure report has been included in a sealed envelope for you to review at your convenience later.  YOU SHOULD EXPECT: Some feelings of bloating in the abdomen. Passage of more gas than usual.  Walking can help get rid of the air that was put into your GI tract during the procedure and reduce the bloating. If you had a lower endoscopy (such as a colonoscopy or flexible sigmoidoscopy) you may notice spotting of blood in your stool or on the toilet paper. If you underwent a bowel prep for your procedure, you may not have a normal bowel movement for a few days.  Please Note:  You might notice some irritation and congestion in your nose or some drainage.  This is from the oxygen used during your procedure.  There is no need for concern and it should clear up in a day or so.  SYMPTOMS TO REPORT IMMEDIATELY:  Following lower endoscopy (colonoscopy):  Excessive amounts of blood in the stool  Significant tenderness or worsening of abdominal pains  Swelling of the abdomen that is new, acute  Fever of 100F or higher  For urgent or emergent issues, a gastroenterologist can be reached at any hour by calling [phone]. Do not use MyChart messaging for urgent concerns.   DIET:  We do recommend a small meal at first, but then you may proceed to your regular diet.  Drink plenty of fluids but you should avoid alcoholic beverages for 24 hours.  ACTIVITY:  You should plan to take  it easy for the rest of today and you should NOT DRIVE or use heavy machinery until tomorrow (because of the sedation medicines used during the test).    FOLLOW UP: Our staff will call the number listed on your records the next business day following your procedure.  We will call around 7:15- 8:00 am to check on you and address any questions or concerns that you may have regarding the information given to you following your procedure. If we do not reach you, we will leave a message.     If any biopsies were taken you will be contacted by phone or by letter within the next 1-3 weeks.  Please call us at [phone] if you have not heard about the biopsies in 3 weeks.    SIGNATURES/CONFIDENTIALITY: You and/or your care partner have signed paperwork which will be entered into your electronic medical record.  These signatures attest to the fact that that the information above on your After Visit Summary has been reviewed and is understood.  Full responsibility of the confidentiality of this discharge information lies with you and/or your care-partner.

## 2023-01-07 ENCOUNTER — Telehealth: Payer: Self-pay

## 2023-01-07 NOTE — Telephone Encounter (Signed)
No answer, left message to call if having any issues or concerns, B.Druscilla Petsch RN 

## 2023-01-14 ENCOUNTER — Telehealth: Payer: Self-pay

## 2023-01-14 NOTE — Telephone Encounter (Signed)
Caller & Relationship to patient:  MRN #  [id number]   Call Back Number: [phone]   Date of Last Office Visit: 12/05/2022     Date of Next Office Visit: Visit date not found    Medication(s) to be Refilled: Januvia  Preferred Pharmacy: [facility]- [address]  ** Please notify patient to allow 48-72 hours to process** **Let patient know to contact pharmacy at the end of the day to make sure medication is ready. ** **If patient has not been seen in a year or longer, book an appointment **Advise to use MyChart for refill requests OR to contact their pharmacy

## 2023-01-15 ENCOUNTER — Encounter: Payer: Self-pay | Admitting: Gastroenterology

## 2023-01-16 ENCOUNTER — Other Ambulatory Visit: Payer: Self-pay

## 2023-01-16 DIAGNOSIS — E1165 Type 2 diabetes mellitus with hyperglycemia: Secondary | ICD-10-CM

## 2023-01-16 MED ORDER — SITAGLIPTIN PHOSPHATE 50 MG PO TABS
50.0000 mg | ORAL_TABLET | Freq: Every day | ORAL | 0 refills | Status: DC
Start: 2023-01-16 — End: 2023-01-25

## 2023-01-16 NOTE — Telephone Encounter (Signed)
Medication sent in to pt pharmacy. Pt is schedule for F/U with Fola on 01/22/23.

## 2023-01-22 ENCOUNTER — Ambulatory Visit: Payer: Medicare HMO | Admitting: Nurse Practitioner

## 2023-01-22 NOTE — Progress Notes (Signed)
Pt is schedule on 01/25/23. Gh

## 2023-01-25 ENCOUNTER — Encounter: Payer: Self-pay | Admitting: Nurse Practitioner

## 2023-01-25 ENCOUNTER — Ambulatory Visit (INDEPENDENT_AMBULATORY_CARE_PROVIDER_SITE_OTHER): Payer: Medicare HMO | Admitting: Nurse Practitioner

## 2023-01-25 VITALS — BP 130/63 | HR 81 | Temp 97.4°F | Ht 69.0 in | Wt 189.2 lb

## 2023-01-25 DIAGNOSIS — Z794 Long term (current) use of insulin: Secondary | ICD-10-CM | POA: Insufficient documentation

## 2023-01-25 DIAGNOSIS — M25512 Pain in left shoulder: Secondary | ICD-10-CM

## 2023-01-25 DIAGNOSIS — F330 Major depressive disorder, recurrent, mild: Secondary | ICD-10-CM | POA: Diagnosis not present

## 2023-01-25 DIAGNOSIS — M25511 Pain in right shoulder: Secondary | ICD-10-CM | POA: Diagnosis not present

## 2023-01-25 DIAGNOSIS — G8929 Other chronic pain: Secondary | ICD-10-CM

## 2023-01-25 DIAGNOSIS — E785 Hyperlipidemia, unspecified: Secondary | ICD-10-CM

## 2023-01-25 DIAGNOSIS — I10 Essential (primary) hypertension: Secondary | ICD-10-CM | POA: Diagnosis not present

## 2023-01-25 DIAGNOSIS — E1165 Type 2 diabetes mellitus with hyperglycemia: Secondary | ICD-10-CM | POA: Diagnosis not present

## 2023-01-25 DIAGNOSIS — E1169 Type 2 diabetes mellitus with other specified complication: Secondary | ICD-10-CM

## 2023-01-25 DIAGNOSIS — I739 Peripheral vascular disease, unspecified: Secondary | ICD-10-CM

## 2023-01-25 LAB — POCT GLYCOSYLATED HEMOGLOBIN (HGB A1C): Hemoglobin A1C: 7.9 % — AB (ref 4.0–5.6)

## 2023-01-25 MED ORDER — METFORMIN HCL 1000 MG PO TABS
ORAL_TABLET | ORAL | 3 refills | Status: DC
Start: 2023-01-25 — End: 2023-02-27

## 2023-01-25 MED ORDER — IBUPROFEN 600 MG PO TABS
600.0000 mg | ORAL_TABLET | Freq: Three times a day (TID) | ORAL | 0 refills | Status: AC | PRN
Start: 2023-01-25 — End: ?

## 2023-01-25 MED ORDER — SITAGLIPTIN PHOSPHATE 100 MG PO TABS
100.0000 mg | ORAL_TABLET | Freq: Every day | ORAL | 1 refills | Status: DC
Start: 2023-01-25 — End: 2023-02-27

## 2023-01-25 MED ORDER — ATORVASTATIN CALCIUM 20 MG PO TABS: 20.0000 mg | ORAL_TABLET | Freq: Every day | ORAL | 0 refills | Status: DC

## 2023-01-25 MED ORDER — CITALOPRAM HYDROBROMIDE 40 MG PO TABS: 40.0000 mg | ORAL_TABLET | Freq: Every day | ORAL | 2 refills | Status: DC

## 2023-01-25 MED ORDER — GLIPIZIDE 10 MG PO TABS: 10.0000 mg | ORAL_TABLET | Freq: Two times a day (BID) | ORAL | 2 refills | Status: DC

## 2023-01-25 NOTE — Assessment & Plan Note (Signed)
Continue atorvastatin 20 mg daily No complaints today

## 2023-01-25 NOTE — Assessment & Plan Note (Signed)
Flowsheet Row Office Visit from 01/25/2023 in [facility]  PHQ-9 Total Score 10     She denies SI, HI States that she feels bored more than been depressed She is a Curator , watches services online, we discussed attending in person services since she is bored.  Continue Celexa 40 mg daily

## 2023-01-25 NOTE — Patient Instructions (Addendum)
Goal for fasting blood sugar ranges from 80 to 120 and 2 hours after any meal or at bedtime should be between 130 to 170.   Type 2 diabetes mellitus with hyperglycemia, with long-term current use of insulin (HCC)  - POCT glycosylated hemoglobin (Hb A1C) - sitaGLIPtin (JANUVIA) 100 MG tablet; Take 1 tablet (100 mg total) by mouth daily.  Dispense: 90 tablet; Refill: 1 - glipiZIDE (GLUCOTROL) 10 MG tablet; Take 1 tablet (10 mg total) by mouth 2 (two) times daily before a meal.  Dispense: 180 tablet; Refill: 2 - metFORMIN (GLUCOPHAGE) 1000 MG tablet; TAKE 1 TABLET BY MOUTH TWICE DAILY WITH MEALS  Dispense: 180 tablet; Refill: 3 - atorvastatin (LIPITOR) 20 MG tablet; Take 1 tablet (20 mg total) by mouth daily.  Dispense: 90 tablet; Refill: 0 . Hyperlipidemia associated with type 2 diabetes mellitus (HCC)  - atorvastatin (LIPITOR) 20 MG tablet; Take 1 tablet (20 mg total) by mouth daily.  Dispense: 90 tablet; Refill: 0  . Mild episode of recurrent major depressive disorder (HCC)  - citalopram (CELEXA) 40 MG tablet; Take 1 tablet (40 mg total) by mouth daily.  Dispense: 90 tablet; Refill: 2    . Chronic pain of both shoulders  - ibuprofen (ADVIL) 600 MG tablet; Take 1 tablet (600 mg total) by mouth every 8 (eight) hours as needed.  Dispense: 30 tablet; Refill: 0        It is important that you exercise regularly at least 30 minutes 5 times a week as tolerated  Think about what you will eat, plan ahead. Choose " clean, green, fresh or frozen" over canned, processed or packaged foods which are more sugary, salty and fatty. 70 to 75% of food eaten should be vegetables and fruit. Three meals at set times with snacks allowed between meals, but they must be fruit or vegetables. Aim to eat over a 12 hour period , example 7 am to 7 pm, and STOP after  your last meal of the day. Drink water,generally about 64 ounces per day, no other drink is as healthy. Fruit juice is best enjoyed in a healthy  way, by EATING the fruit.  [facility] for [facility] we consider it a privelige to serve you.
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
## 2023-01-25 NOTE — Assessment & Plan Note (Signed)
Has Sharp pain when raining, exercise and  medicine helps.  Patient encouraged to continue stretching exercises Ibuprofen refilled

## 2023-01-25 NOTE — Assessment & Plan Note (Addendum)
Lab Results  Component Value Date   HGBA1C 7.3 (A) 10/24/2022  A1c 7.9 today, denies hypoglycemia no complaints of polyphagia, polyuria Reports CBG readings of 200s at home Currently on glipizide 10 mg twice daily, Januvia 50 mg daily, metformin 1000 mg twice daily.  Has not had insulin in over 3 months. Continue current dose of glipizide 10 mg twice daily, metformin 1000 mg twice daily.  Start Januvia 100 mg daily CBG goals discussed Patient counseled on low-carb modified diet Has upcoming diabetic eye exam Diabetic foot exam completed today

## 2023-01-25 NOTE — Assessment & Plan Note (Signed)
Fasting lipid panel ordered but has not been done She is not fasting today Currently on atorvastatin 20 mg daily Patient encouraged to get her fasting lipid panel done

## 2023-01-25 NOTE — Assessment & Plan Note (Signed)
BP Readings from Last 3 Encounters:  01/25/23 130/63  01/04/23 128/68  11/05/22 (!) 116/58  Currently not on medication HTN Controlled . Continue current medications. No changes in management. Discussed DASH diet and dietary sodium restrictions Continue to increase dietary efforts and exercise.

## 2023-01-31 ENCOUNTER — Other Ambulatory Visit: Payer: Medicare HMO

## 2023-01-31 DIAGNOSIS — Z794 Long term (current) use of insulin: Secondary | ICD-10-CM

## 2023-02-01 LAB — LIPID PANEL
Chol/HDL Ratio: 2.3 ratio (ref 0.0–4.4)
Cholesterol, Total: 126 mg/dL (ref 100–199)
HDL: 56 mg/dL (ref >39)
LDL Chol Calc (NIH): 55 mg/dL (ref 0–99)
Triglycerides: 73 mg/dL (ref 0–149)
VLDL Cholesterol Cal: 15 mg/dL (ref 5–40)

## 2023-02-01 LAB — HEMOGLOBIN A1C
Est. average glucose Bld gHb Est-mCnc: 194 mg/dL
Hgb A1c MFr Bld: 8.4 % — ABNORMAL HIGH (ref 4.8–5.6)

## 2023-02-06 ENCOUNTER — Telehealth: Payer: Self-pay | Admitting: Nurse Practitioner

## 2023-02-06 NOTE — Telephone Encounter (Signed)
Called patient to schedule Medicare Annual Wellness Visit (AWV). Left message for patient to call back and schedule Medicare Annual Wellness Visit (AWV).  Last date of AWV: 02/09/2022   Please schedule an appointment at any time with Abby, NHA.   If any questions, please contact me at [phone].  Thank you,  Judeth Cornfield,  AMB Clinical Support [facility] AWV Program Direct Dial ??[phone]

## 2023-02-19 NOTE — Progress Notes (Signed)
Called pt and inform message. Gh 

## 2023-02-27 ENCOUNTER — Other Ambulatory Visit: Payer: Self-pay

## 2023-02-27 ENCOUNTER — Other Ambulatory Visit: Payer: Self-pay | Admitting: Nurse Practitioner

## 2023-02-27 ENCOUNTER — Telehealth: Payer: Self-pay

## 2023-02-27 DIAGNOSIS — E1165 Type 2 diabetes mellitus with hyperglycemia: Secondary | ICD-10-CM

## 2023-02-27 DIAGNOSIS — E1169 Type 2 diabetes mellitus with other specified complication: Secondary | ICD-10-CM

## 2023-02-27 DIAGNOSIS — M9908 Segmental and somatic dysfunction of rib cage: Secondary | ICD-10-CM

## 2023-02-27 DIAGNOSIS — F330 Major depressive disorder, recurrent, mild: Secondary | ICD-10-CM

## 2023-02-27 MED ORDER — CYCLOBENZAPRINE HCL 5 MG PO TABS
5.0000 mg | ORAL_TABLET | Freq: Every evening | ORAL | 1 refills | Status: DC | PRN
Start: 2023-02-27 — End: 2023-06-03

## 2023-02-27 MED ORDER — ATORVASTATIN CALCIUM 20 MG PO TABS
20.0000 mg | ORAL_TABLET | Freq: Every day | ORAL | 0 refills | Status: DC
Start: 2023-02-27 — End: 2023-05-09

## 2023-02-27 MED ORDER — SITAGLIPTIN PHOSPHATE 100 MG PO TABS
100.0000 mg | ORAL_TABLET | Freq: Every day | ORAL | 1 refills | Status: DC
Start: 2023-02-27 — End: 2023-08-16

## 2023-02-27 MED ORDER — METFORMIN HCL 1000 MG PO TABS
ORAL_TABLET | ORAL | 3 refills | Status: DC
Start: 2023-02-27 — End: 2024-02-18

## 2023-02-27 MED ORDER — GLIPIZIDE 10 MG PO TABS
10.0000 mg | ORAL_TABLET | Freq: Two times a day (BID) | ORAL | 2 refills | Status: DC
Start: 2023-02-27 — End: 2024-02-18

## 2023-02-27 MED ORDER — CITALOPRAM HYDROBROMIDE 40 MG PO TABS: 40.0000 mg | ORAL_TABLET | Freq: Every day | ORAL | 2 refills | Status: DC

## 2023-03-01 NOTE — Telephone Encounter (Signed)
Provider sent in medication to pharmacy. Gh

## 2023-03-12 DIAGNOSIS — E1151 Type 2 diabetes mellitus with diabetic peripheral angiopathy without gangrene: Secondary | ICD-10-CM | POA: Diagnosis not present

## 2023-03-12 DIAGNOSIS — M545 Low back pain, unspecified: Secondary | ICD-10-CM | POA: Diagnosis not present

## 2023-03-12 DIAGNOSIS — Z7984 Long term (current) use of oral hypoglycemic drugs: Secondary | ICD-10-CM | POA: Diagnosis not present

## 2023-03-12 DIAGNOSIS — E785 Hyperlipidemia, unspecified: Secondary | ICD-10-CM | POA: Diagnosis not present

## 2023-03-12 DIAGNOSIS — F419 Anxiety disorder, unspecified: Secondary | ICD-10-CM | POA: Diagnosis not present

## 2023-03-12 DIAGNOSIS — F325 Major depressive disorder, single episode, in full remission: Secondary | ICD-10-CM | POA: Diagnosis not present

## 2023-03-12 DIAGNOSIS — Z87891 Personal history of nicotine dependence: Secondary | ICD-10-CM | POA: Diagnosis not present

## 2023-03-12 DIAGNOSIS — M1990 Unspecified osteoarthritis, unspecified site: Secondary | ICD-10-CM | POA: Diagnosis not present

## 2023-03-12 DIAGNOSIS — R030 Elevated blood-pressure reading, without diagnosis of hypertension: Secondary | ICD-10-CM | POA: Diagnosis not present

## 2023-04-10 DIAGNOSIS — E1165 Type 2 diabetes mellitus with hyperglycemia: Secondary | ICD-10-CM | POA: Diagnosis not present

## 2023-04-10 DIAGNOSIS — Z79899 Other long term (current) drug therapy: Secondary | ICD-10-CM | POA: Diagnosis not present

## 2023-04-10 DIAGNOSIS — Z136 Encounter for screening for cardiovascular disorders: Secondary | ICD-10-CM | POA: Diagnosis not present

## 2023-04-10 DIAGNOSIS — Z1159 Encounter for screening for other viral diseases: Secondary | ICD-10-CM | POA: Diagnosis not present

## 2023-04-10 DIAGNOSIS — R6889 Other general symptoms and signs: Secondary | ICD-10-CM | POA: Diagnosis not present

## 2023-04-10 DIAGNOSIS — E1169 Type 2 diabetes mellitus with other specified complication: Secondary | ICD-10-CM | POA: Diagnosis not present

## 2023-04-10 DIAGNOSIS — F3342 Major depressive disorder, recurrent, in full remission: Secondary | ICD-10-CM | POA: Diagnosis not present

## 2023-04-10 DIAGNOSIS — E782 Mixed hyperlipidemia: Secondary | ICD-10-CM | POA: Diagnosis not present

## 2023-04-10 DIAGNOSIS — Z114 Encounter for screening for human immunodeficiency virus [HIV]: Secondary | ICD-10-CM | POA: Diagnosis not present

## 2023-04-10 DIAGNOSIS — I739 Peripheral vascular disease, unspecified: Secondary | ICD-10-CM | POA: Diagnosis not present

## 2023-04-26 ENCOUNTER — Ambulatory Visit: Payer: Self-pay | Admitting: Nurse Practitioner

## 2023-05-09 ENCOUNTER — Other Ambulatory Visit: Payer: Self-pay | Admitting: Nurse Practitioner

## 2023-05-09 DIAGNOSIS — E1165 Type 2 diabetes mellitus with hyperglycemia: Secondary | ICD-10-CM

## 2023-05-09 DIAGNOSIS — E785 Hyperlipidemia, unspecified: Secondary | ICD-10-CM

## 2023-05-31 DIAGNOSIS — R6889 Other general symptoms and signs: Secondary | ICD-10-CM | POA: Diagnosis not present

## 2023-06-01 ENCOUNTER — Other Ambulatory Visit: Payer: Self-pay | Admitting: Nurse Practitioner

## 2023-06-01 DIAGNOSIS — M9908 Segmental and somatic dysfunction of rib cage: Secondary | ICD-10-CM

## 2023-07-03 DIAGNOSIS — I10 Essential (primary) hypertension: Secondary | ICD-10-CM | POA: Diagnosis not present

## 2023-07-03 DIAGNOSIS — K219 Gastro-esophageal reflux disease without esophagitis: Secondary | ICD-10-CM | POA: Diagnosis not present

## 2023-07-03 DIAGNOSIS — E1165 Type 2 diabetes mellitus with hyperglycemia: Secondary | ICD-10-CM | POA: Diagnosis not present

## 2023-07-03 DIAGNOSIS — Z0001 Encounter for general adult medical examination with abnormal findings: Secondary | ICD-10-CM | POA: Diagnosis not present

## 2023-07-03 DIAGNOSIS — I739 Peripheral vascular disease, unspecified: Secondary | ICD-10-CM | POA: Diagnosis not present

## 2023-07-03 DIAGNOSIS — Z0189 Encounter for other specified special examinations: Secondary | ICD-10-CM | POA: Diagnosis not present

## 2023-08-16 ENCOUNTER — Other Ambulatory Visit: Payer: Self-pay | Admitting: Nurse Practitioner

## 2023-08-16 ENCOUNTER — Other Ambulatory Visit (HOSPITAL_COMMUNITY): Payer: Self-pay | Admitting: Nurse Practitioner

## 2023-08-16 DIAGNOSIS — E1165 Type 2 diabetes mellitus with hyperglycemia: Secondary | ICD-10-CM

## 2023-11-07 ENCOUNTER — Other Ambulatory Visit: Payer: Self-pay | Admitting: Nurse Practitioner

## 2023-11-07 DIAGNOSIS — F330 Major depressive disorder, recurrent, mild: Secondary | ICD-10-CM

## 2023-11-13 ENCOUNTER — Telehealth: Payer: Self-pay

## 2023-11-13 NOTE — Telephone Encounter (Signed)
 Patient was identified as falling into the [place] Measure - Diabetes.   Patient was: Left voicemail to schedule with primary care provider.

## 2023-11-18 ENCOUNTER — Other Ambulatory Visit: Payer: Self-pay | Admitting: Pharmacist

## 2023-11-18 NOTE — Progress Notes (Signed)
   11/18/2023 Name: Jennifer Rhodes MRN: [id number] DOB: [birth year]  No chief complaint on file.   [name] is a 63 y.o. year old female who presented for a telephone visit.  Patient was identified as falling into the [place] Measure - Diabetes.  Patient was: Patient refuses intervention.   Outreached patient to discuss scheduling visit with PCP and pharmacist. Patient reports she has a visit scheduled with new PCP at [facility] next week. Plans to transfer her care there and does not need to schedule an appointment at patient care center.  Jarrett Ables, PharmD PGY-1 Pharmacy Resident

## 2023-12-10 ENCOUNTER — Telehealth: Payer: Self-pay

## 2023-12-10 DIAGNOSIS — E1165 Type 2 diabetes mellitus with hyperglycemia: Secondary | ICD-10-CM

## 2023-12-10 NOTE — Progress Notes (Signed)
   12/10/2023  Patient ID: Jennifer Rhodes, female   DOB: 1961/04/09, 63 y.o.   MRN: [id number]  Attempted to contact patient for medication management/review. Left HIPAA compliant message for patient to return my call at their convenience.   First attempt for patient outreach.  Per chart review, patient has declined services for [facility] and also plans to transfer care. Additionally, patient has not seen in clinic by PCP in about 9 months and has been seeing another provider already. Re-attributed needs to be updated to verify patient's clinic assignment.   Thank you for allowing pharmacy to be a part of this patient's care.  Cephus Shelling, PharmD Clinical Pharmacist Cell: [phone]

## 2023-12-12 ENCOUNTER — Other Ambulatory Visit: Payer: Self-pay | Admitting: Family Medicine

## 2023-12-12 DIAGNOSIS — Z1231 Encounter for screening mammogram for malignant neoplasm of breast: Secondary | ICD-10-CM

## 2024-01-01 ENCOUNTER — Ambulatory Visit

## 2024-01-08 ENCOUNTER — Ambulatory Visit
Admission: RE | Admit: 2024-01-08 | Discharge: 2024-01-08 | Disposition: A | Discharge status: Home / Self Care | Source: Ambulatory Visit | Attending: Family Medicine | Admitting: Family Medicine

## 2024-01-08 DIAGNOSIS — Z1231 Encounter for screening mammogram for malignant neoplasm of breast: Secondary | ICD-10-CM

## 2024-02-15 ENCOUNTER — Other Ambulatory Visit: Payer: Self-pay | Admitting: Nurse Practitioner

## 2024-02-15 DIAGNOSIS — E1169 Type 2 diabetes mellitus with other specified complication: Secondary | ICD-10-CM

## 2024-02-15 DIAGNOSIS — E1165 Type 2 diabetes mellitus with hyperglycemia: Secondary | ICD-10-CM

## 2024-05-28 ENCOUNTER — Telehealth: Payer: Self-pay | Admitting: Nurse Practitioner

## 2024-05-28 NOTE — Telephone Encounter (Signed)
 Please call patient to schedule PCP appt  Patient was identified as falling into the [place] Measure - Diabetes.   Patient was: Left voicemail to schedule with primary care provider.

## 2024-06-01 NOTE — Telephone Encounter (Signed)
 Called patient to schedule appt. Patient phone rang, then a recording came on stating that the call cannot be completed as dialed please try your call later.

## 2024-06-12 ENCOUNTER — Telehealth: Payer: Self-pay | Admitting: Nurse Practitioner

## 2024-06-12 NOTE — Telephone Encounter (Signed)
 Patient was identified as falling into the [place] Measure - Diabetes.   Patient was: Left voicemail to schedule with primary care provider.  Unable to contact pt by phone. Requested a letter to send to patient.

## 2024-06-29 ENCOUNTER — Telehealth: Payer: Self-pay | Admitting: Nurse Practitioner

## 2024-06-29 NOTE — Telephone Encounter (Signed)
 Patient was identified as falling into the [place] Measure - Diabetes.   Patient was: Left voicemail to schedule with primary care provider.  Attempted to contact patient - no VM available. Pt requires PCP appt
